# Patient Record
Sex: Female | Born: 1950 | Race: White | Hispanic: No | Marital: Single | Smoking: Former smoker
Health system: Southern US, Community
[De-identification: ages and names within clinical notes are randomized; demographics above are authoritative.]

## PROBLEM LIST (undated history)

## (undated) DIAGNOSIS — D1803 Hemangioma of intra-abdominal structures: Secondary | ICD-10-CM

## (undated) DIAGNOSIS — H02429 Myogenic ptosis of unspecified eyelid: Secondary | ICD-10-CM

## (undated) DIAGNOSIS — F419 Anxiety disorder, unspecified: Secondary | ICD-10-CM

## (undated) DIAGNOSIS — I341 Nonrheumatic mitral (valve) prolapse: Secondary | ICD-10-CM

## (undated) DIAGNOSIS — M94269 Chondromalacia, unspecified knee: Secondary | ICD-10-CM

## (undated) DIAGNOSIS — G4700 Insomnia, unspecified: Secondary | ICD-10-CM

## (undated) DIAGNOSIS — K824 Cholesterolosis of gallbladder: Secondary | ICD-10-CM

## (undated) DIAGNOSIS — R112 Nausea with vomiting, unspecified: Secondary | ICD-10-CM

## (undated) DIAGNOSIS — IMO0002 Reserved for concepts with insufficient information to code with codable children: Secondary | ICD-10-CM

## (undated) DIAGNOSIS — Z87442 Personal history of urinary calculi: Secondary | ICD-10-CM

## (undated) DIAGNOSIS — R55 Syncope and collapse: Secondary | ICD-10-CM

## (undated) DIAGNOSIS — T4145XA Adverse effect of unspecified anesthetic, initial encounter: Secondary | ICD-10-CM

## (undated) DIAGNOSIS — R011 Cardiac murmur, unspecified: Secondary | ICD-10-CM

## (undated) DIAGNOSIS — E7800 Pure hypercholesterolemia, unspecified: Secondary | ICD-10-CM

## (undated) DIAGNOSIS — M1990 Unspecified osteoarthritis, unspecified site: Secondary | ICD-10-CM

## (undated) DIAGNOSIS — H02413 Mechanical ptosis of bilateral eyelids: Secondary | ICD-10-CM

## (undated) DIAGNOSIS — Z98811 Dental restoration status: Secondary | ICD-10-CM

## (undated) DIAGNOSIS — I639 Cerebral infarction, unspecified: Secondary | ICD-10-CM

## (undated) DIAGNOSIS — I469 Cardiac arrest, cause unspecified: Secondary | ICD-10-CM

## (undated) DIAGNOSIS — K219 Gastro-esophageal reflux disease without esophagitis: Secondary | ICD-10-CM

## (undated) DIAGNOSIS — Z9889 Other specified postprocedural states: Secondary | ICD-10-CM

## (undated) DIAGNOSIS — R0602 Shortness of breath: Secondary | ICD-10-CM

## (undated) DIAGNOSIS — I480 Paroxysmal atrial fibrillation: Secondary | ICD-10-CM

## (undated) DIAGNOSIS — M797 Fibromyalgia: Secondary | ICD-10-CM

## (undated) DIAGNOSIS — T8859XA Other complications of anesthesia, initial encounter: Secondary | ICD-10-CM

## (undated) DIAGNOSIS — S83289A Other tear of lateral meniscus, current injury, unspecified knee, initial encounter: Secondary | ICD-10-CM

## (undated) DIAGNOSIS — S83249A Other tear of medial meniscus, current injury, unspecified knee, initial encounter: Secondary | ICD-10-CM

## (undated) DIAGNOSIS — C539 Malignant neoplasm of cervix uteri, unspecified: Secondary | ICD-10-CM

## (undated) HISTORY — DX: Hemangioma of intra-abdominal structures: D18.03

## (undated) HISTORY — DX: Insomnia, unspecified: G47.00

## (undated) HISTORY — DX: Pure hypercholesterolemia, unspecified: E78.00

## (undated) HISTORY — DX: Nonrheumatic mitral (valve) prolapse: I34.1

## (undated) HISTORY — DX: Malignant neoplasm of cervix uteri, unspecified: C53.9

## (undated) HISTORY — DX: Paroxysmal atrial fibrillation: I48.0

## (undated) HISTORY — DX: Cholesterolosis of gallbladder: K82.4

## (undated) HISTORY — PX: TUBAL LIGATION: SHX77

## (undated) HISTORY — PX: DILATION AND CURETTAGE OF UTERUS: SHX78

## (undated) HISTORY — PX: KIDNEY STONE SURGERY: SHX686

---
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
## 1982-06-10 DIAGNOSIS — C539 Malignant neoplasm of cervix uteri, unspecified: Secondary | ICD-10-CM

## 1982-06-10 HISTORY — DX: Malignant neoplasm of cervix uteri, unspecified: C53.9

## 1989-06-10 HISTORY — PX: TOTAL ABDOMINAL HYSTERECTOMY: SHX209

## 2003-06-11 DIAGNOSIS — R55 Syncope and collapse: Secondary | ICD-10-CM

## 2003-06-11 HISTORY — DX: Syncope and collapse: R55

## 2004-03-19 ENCOUNTER — Inpatient Hospital Stay (HOSPITAL_COMMUNITY): Admission: EM | Admit: 2004-03-19 | Discharge: 2004-03-20 | Payer: Self-pay | Admitting: *Deleted

## 2004-06-21 ENCOUNTER — Other Ambulatory Visit: Admission: RE | Admit: 2004-06-21 | Discharge: 2004-06-21 | Payer: Self-pay | Admitting: Family Medicine

## 2004-06-21 ENCOUNTER — Ambulatory Visit (HOSPITAL_COMMUNITY): Admission: RE | Admit: 2004-06-21 | Discharge: 2004-06-21 | Payer: Self-pay | Admitting: Family Medicine

## 2007-06-24 ENCOUNTER — Other Ambulatory Visit: Admission: RE | Admit: 2007-06-24 | Discharge: 2007-06-24 | Payer: Self-pay | Admitting: Family Medicine

## 2007-09-09 ENCOUNTER — Emergency Department (HOSPITAL_COMMUNITY): Admission: EM | Admit: 2007-09-09 | Discharge: 2007-09-09 | Payer: Self-pay | Admitting: Emergency Medicine

## 2009-02-27 ENCOUNTER — Emergency Department (HOSPITAL_COMMUNITY): Admission: EM | Admit: 2009-02-27 | Discharge: 2009-02-28 | Payer: Self-pay | Admitting: Emergency Medicine

## 2010-06-07 ENCOUNTER — Emergency Department (HOSPITAL_COMMUNITY)
Admission: EM | Admit: 2010-06-07 | Discharge: 2010-06-07 | Payer: Self-pay | Source: Home / Self Care | Admitting: Emergency Medicine

## 2010-06-21 ENCOUNTER — Encounter
Admission: RE | Admit: 2010-06-21 | Discharge: 2010-06-21 | Payer: Self-pay | Source: Home / Self Care | Attending: Family Medicine | Admitting: Family Medicine

## 2010-09-14 LAB — URINALYSIS, ROUTINE W REFLEX MICROSCOPIC
Bilirubin Urine: NEGATIVE
Glucose, UA: NEGATIVE mg/dL
Hgb urine dipstick: NEGATIVE
Ketones, ur: NEGATIVE mg/dL
Nitrite: NEGATIVE
Protein, ur: NEGATIVE mg/dL
Specific Gravity, Urine: 1.009 (ref 1.005–1.030)
Urobilinogen, UA: 0.2 mg/dL (ref 0.0–1.0)
pH: 8 (ref 5.0–8.0)

## 2010-10-26 NOTE — H&P (Signed)
NAMESAMANTHAJO, PAYANO           ACCOUNT NO.:  [id number]   MEDICAL RECORD NO.:  [id number]          PATIENT TYPE:  INP   LOCATION:  0350                         FACILITY:  [facility]   PHYSICIAN:  Kela Millin, M.D.DATE OF BIRTH:  05/03/1951   DATE OF ADMISSION:  03/19/2004  DATE OF DISCHARGE:                                HISTORY & PHYSICAL   PRIMARY CARE PHYSICIAN:  Stacie Acres. White, M.D.   CHIEF COMPLAINT:  Back pain, syncope, and vomiting.   HISTORY OF PRESENT ILLNESS:  The patient is a 60 year old white female who  presents with the above complaints x1 day.  She states that she began having  low back pain early in the a.m. of presentation, and after a while it  localized to her right lower back area, and she took aspirin, Advil, and  Tylenol with codeine with no relief.  She had some nausea and vomiting, but  denied hematemesis.  She began to feel lightheaded, went into her living  room, sat down, and then passed out.  When she woke up, she was on the  floor.  She denies chest pain, palpitations, cough, fevers, dysuria,  hematemesis, hematochezia, shortness of breath, and no PND.  Also no  hematuria, and no leg swelling.   The patient was evaluated in the ER and given anti-emetics, but nausea and  vomiting persisted, and also a CT scan of the abdomen was done and noted to  be abnormal, and the patient is admitted to the [facility] hospitalist service  for further evaluation and management.   PAST MEDICAL HISTORY:  1.  History of mitral valve prolapse.  2.  History of patent ductus - status post surgery.   PAST SURGICAL HISTORY:  Status post hysterectomy in 1991.   MEDICATIONS:  Estrogen.   ALLERGIES:  No known drug allergies.   SOCIAL HISTORY:  Quit tobacco about 14 years ago.  Denies alcohol.  Also  denies illicit drug use.   FAMILY HISTORY:  Dad had a PE and myocardial infarction at age 10.   REVIEW OF SYSTEMS:  As per HPI.  Other comprehensive review of systems  negative.   PHYSICAL EXAMINATION:  GENERAL:  The patient is a middle-aged white female,  in no acute distress.  VITAL SIGNS:  Blood pressure 141/91, pulse 93, respiratory rate 20, and O2  saturation 98%.  HEENT:  Pupils equal, round and reactive to light.  Extraocular movements  intact.  Slightly dry mucous membranes.  Sclerae are anicteric.  No oral  exudates.  NECK:  Supple.  No adenopathy, no thyromegaly.  No carotid bruits.  LUNGS:  Clear to auscultation bilaterally.  No crackles or wheezes.  CARDIOVASCULAR:  Normal S1 and S2.  Regular rate and rhythm.  No murmurs  appreciated.  ABDOMEN:  Soft.  Bowel sounds present.  Nontender, nondistended.  No  organomegaly and no masses palpable.  BACK:  Right paralumbar tenderness.  No tenderness over vertebrae.  EXTREMITIES:  No clubbing, cyanosis, or edema.  NEUROLOGIC:  Alert and oriented x3.  Cranial nerves II-XII grossly intact.  Nonfocal exam.   LABORATORY DATA:  CT scan of  the abdomen - there is a 17 mm low density  lesion in the inferior aspect of the right lobe of the liver.  There is a  possible 2 mm stone in the distal right ureter, and the patient also noted  to have scattered phleboliths, and calcification in ureter might be a  phlebolith rather than a ureteral stone.  There is a slight dilatation of  the right mid ureter, and there is a slight prominence of the pancreatic  head noted.   Abdominal ultrasound - multiple gallbladder polyps, the largest measuring 7  mm.  A small echogenic lesion without the inferior right lobe of the liver  measuring about 7 mm, most likely representing a small hemangioma.  No  evidence of a discrete pancreatic mass or peripancreatic soft tissue  abnormality.   The white cell count is 11, the hemoglobin is 13.1, hematocrit 39.2.  The  neutrophil count is 81%.  The sodium is 138, potassium 3.6, chloride 107,  CO2 is 24, BUN 12, glucose 129, creatinine 0.6, calcium 9.0.  The urinalysis  is cloudy  with a pH of 8.5, specific gravity of 1.019, nitrite negative,  small leukocyte esterase, and 0-2 WBC's.  Many uterus epithelial cells.   ASSESSMENT AND PLAN:  1.  Syncope - (?) vasovagal.  Will obtain CT scan of the head.  Will also      check cardiac enzymes and EKG, telemetry monitoring, and check      orthostatics and follow.  2.  Phlebolith versus ureteral stone - the patient with back pain, nausea,      vomiting.  CT scan as discussed above.  No hematuria.  Will hydrate with      IV fluids, analgesics for pain control, and muscle relaxants.  Will      strain urine.  Follow and consider a urology consult for further      evaluation/recommendations.  3.  Volume depletion - IV hydration as above.  Follow.  4.  Elevated blood glucose.  Will check a fasting blood glucose, as well as      hemoglobin A1C in a.m. and follow.  5.  Gallstones polyps/hepatic hemangioma - periodic ultrasound follow up as      recommended per radiology.      ACV/MEDQ  D:  03/20/2004  T:  03/20/2004  Job:  [id number]   cc:   Stacie Acres. White, M.D.  [address]  [place]  [place] [address]  Fax: [phone]

## 2010-10-26 NOTE — Discharge Summary (Signed)
[name], [name]           ACCOUNT NO.:  [id number]   MEDICAL RECORD NO.:  [id number]          PATIENT TYPE:  INP   LOCATION:  0350                         FACILITY:  [facility]   PHYSICIAN:  Corinna L. Lendell Caprice, MDDATE OF BIRTH:  06-29-1950   DATE OF ADMISSION:  03/19/2004  DATE OF DISCHARGE:  03/20/2004                                 DISCHARGE SUMMARY   DIAGNOSES:  1.  Syncope secondary to pain and vasovagal response.  2.  Right ureteral stone.  3.  Vomiting secondary to above.  4.  Hyperglycemia, resolved.  5.  Status post hysterectomy.  6.  History of mitral valve prolapse.  7.  History of patent ductus arteriosus.   DISCHARGE MEDICATIONS:  1.  Percocet one to two p.o. q.4 h. p.r.n. pain, #10 more dispensed.  2.  Compazine 10 mg p.o. q.6 h. p.r.n. pain.  3.  Estradiol 0.5 mg p.o. daily.   Follow up with Dr. Cliffton Asters as needed.   ACTIVITY:  She may return to work in a week.   DIET:  Regular with increased fluid intake.   CONDITION:  Stable.   CONSULTATIONS:  None.   PERTINENT LABORATORY DATA:  Amylase 58, cardiac enzymes negative, LFTs  normal.  Urine showed negative nitrites, small leukocyte esterase but was  contaminated with many epithelial cells.  She had amorphous phosphates in  her urine.  Glucose was 129, otherwise her BMET was normal.  CBC was  significant for a white blood cell count of 11.   SPECIAL STUDIES AND RADIOLOGY:  1.  CT of the abdomen showed right liver lobe hemangioma, slight dilatation      of the right mid ureter, and 2 mm calcification in the distal right      ureter possibly ureteral stone versus phlebolith.  2.  Ultrasound of the right upper quadrant showed the hemangioma and      gallbladder polyps.  Needs ultrasonic follow up.  3.  EKG showed normal sinus rhythm and complete right bundle branch block.   HISTORY AND HOSPITAL COURSE:  Allison Hunt is a pleasant 60 year old white  female who presented with syncope.  She was experiencing  severe back pain,  nausea and vomiting.  The pain was in the right flank.  It was unrelieved by  aspirin, Advil, or Tylenol with Codeine.  She felt lightheaded, sat down on  her couch and passed out.  When she awoke, she was on the floor.  On exam, she had normal vital signs  and a right-sided CVA tenderness.  She was admitted.  She had negative CT of  the brain as well.  Her pain, nausea, vomiting improved.  At the time of  discharge, she was ambulating, tolerating a normal diet, feeling much  better.      Cori   CLS/MEDQ  D:  03/20/2004  T:  03/20/2004  Job:  [id number]   cc:   Stacie Acres. White, M.D.  [address]  [place]  [place] [address]  Fax: [phone]

## 2011-03-05 LAB — COMPREHENSIVE METABOLIC PANEL
ALT: 14
AST: 15
Albumin: 3.5
Alkaline Phosphatase: 59
BUN: 14
CO2: 27
Calcium: 8.2 — ABNORMAL LOW
Chloride: 105
Creatinine, Ser: 0.56
GFR calc Af Amer: >60
GFR calc non Af Amer: >60
Glucose, Bld: 114 — ABNORMAL HIGH
Potassium: 3.8
Sodium: 139
Total Bilirubin: 0.6
Total Protein: 6.2

## 2011-03-05 LAB — URINALYSIS, ROUTINE W REFLEX MICROSCOPIC
Bilirubin Urine: NEGATIVE
Glucose, UA: NEGATIVE
Hgb urine dipstick: NEGATIVE
Ketones, ur: NEGATIVE
Nitrite: NEGATIVE
Protein, ur: NEGATIVE
Specific Gravity, Urine: 1.026
Urobilinogen, UA: 1
pH: 7

## 2011-03-05 LAB — DIFFERENTIAL
Basophils Absolute: 0
Basophils Relative: 0
Eosinophils Absolute: 0.1
Eosinophils Relative: 1
Lymphocytes Relative: 10 — ABNORMAL LOW
Lymphs Abs: 0.8
Monocytes Absolute: 0.4
Monocytes Relative: 5
Neutro Abs: 6.5
Neutrophils Relative %: 83 — ABNORMAL HIGH

## 2011-03-05 LAB — CBC
HCT: 35.9 — ABNORMAL LOW
Hemoglobin: 12.6
MCHC: 35.2
MCV: 87
Platelets: 310
RBC: 4.13
RDW: 12.6
WBC: 7.9

## 2011-03-05 LAB — URINE MICROSCOPIC-ADD ON

## 2011-03-05 LAB — LIPASE, BLOOD: Lipase: 21

## 2011-08-19 ENCOUNTER — Encounter: Payer: Self-pay | Admitting: *Deleted

## 2011-09-04 ENCOUNTER — Ambulatory Visit
Admission: RE | Admit: 2011-09-04 | Discharge: 2011-09-04 | Disposition: A | Discharge status: Home / Self Care | Payer: BC Managed Care – PPO | Source: Ambulatory Visit | Attending: Family Medicine | Admitting: Family Medicine

## 2011-09-04 ENCOUNTER — Other Ambulatory Visit: Payer: Self-pay | Admitting: Family Medicine

## 2011-09-04 DIAGNOSIS — W19XXXA Unspecified fall, initial encounter: Secondary | ICD-10-CM

## 2011-09-09 DIAGNOSIS — S83289A Other tear of lateral meniscus, current injury, unspecified knee, initial encounter: Secondary | ICD-10-CM

## 2011-09-09 DIAGNOSIS — M94269 Chondromalacia, unspecified knee: Secondary | ICD-10-CM

## 2011-09-09 DIAGNOSIS — S83249A Other tear of medial meniscus, current injury, unspecified knee, initial encounter: Secondary | ICD-10-CM

## 2011-09-09 HISTORY — DX: Chondromalacia, unspecified knee: M94.269

## 2011-09-09 HISTORY — DX: Other tear of medial meniscus, current injury, unspecified knee, initial encounter: S83.249A

## 2011-09-09 HISTORY — DX: Other tear of lateral meniscus, current injury, unspecified knee, initial encounter: S83.289A

## 2011-09-19 ENCOUNTER — Other Ambulatory Visit: Payer: Self-pay | Admitting: Physician Assistant

## 2011-09-19 DIAGNOSIS — M25469 Effusion, unspecified knee: Secondary | ICD-10-CM

## 2011-09-21 ENCOUNTER — Other Ambulatory Visit: Payer: BC Managed Care – PPO

## 2011-09-23 ENCOUNTER — Ambulatory Visit
Admission: RE | Admit: 2011-09-23 | Discharge: 2011-09-23 | Disposition: A | Discharge status: Home / Self Care | Payer: BC Managed Care – PPO | Source: Ambulatory Visit | Attending: Physician Assistant | Admitting: Physician Assistant

## 2011-09-23 DIAGNOSIS — M25469 Effusion, unspecified knee: Secondary | ICD-10-CM

## 2011-09-27 ENCOUNTER — Other Ambulatory Visit: Payer: Self-pay | Admitting: Orthopaedic Surgery

## 2011-10-03 ENCOUNTER — Encounter (HOSPITAL_BASED_OUTPATIENT_CLINIC_OR_DEPARTMENT_OTHER): Payer: Self-pay | Admitting: *Deleted

## 2011-10-03 NOTE — Pre-Procedure Instructions (Signed)
Cardiology notes req. from Dr. Harvie Bridge office

## 2011-10-03 NOTE — H&P (Signed)
Allison Hunt is an 61 y.o. female.   Chief Complaint: Right knee pain HPI: She has fallen twice in the last number of weeks injuring her right knee. She now is having knee pain at work and at rest. A recent MRI scan shows medial and lateral meniscus tears. We have discussed options with her and will proceed with a right knee arthroscopy.  Past Medical History  Diagnosis Date  . Mitral valve prolapse   . Insomnia   . Positional vertigo   . Arthritis     osteoarthritis both knees  . Kidney stones 2005  . Complication of anesthesia     hard to wake up  . PONV (postoperative nausea and vomiting)   . Cervical cancer 1984  . Medial meniscus tear 09/2011    right knee  . Lateral meniscal tear 09/2011    right knee  . 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 Dental crowns present     Past Surgical History  Procedure Date  . Total abdominal hysterectomy 1991    due to cervical cancer  . Patent ductus arterious repair 1956    Family History  Problem Relation Age of Onset  . Anemia Father   . Heart attack Father   . Pulmonary embolism Father   . Alzheimer's disease Mother   . Lupus Sister   . Aortic stenosis Son     with AVR at 48 y/o   Social History:  reports that she has quit smoking. She has never used smokeless tobacco. She reports that she drinks alcohol. She reports that she does not use illicit drugs.  Allergies:  Allergies  Allergen Reactions  . Demerol Nausea Only    No prescriptions prior to admission    No results found for this or any previous visit (from the past 48 hour(s)). No results found.  Review of Systems  Constitutional: Negative.   HENT: Negative.   Eyes: Negative.   Respiratory: Negative.   Cardiovascular: Negative.   Gastrointestinal: Negative.   Genitourinary: Negative.   Musculoskeletal: Negative.   Skin: Negative.   Neurological: Negative.   Endo/Heme/Allergies: Negative.   Psychiatric/Behavioral: Negative.     Height 5' 3.5"  (1.613 m), weight 70.308 kg (155 lb). Physical Exam  Constitutional: She appears well-nourished.  HENT:  Head: Normocephalic.  Eyes: Pupils are equal, round, and reactive to light.  Neck: Normal range of motion.  Cardiovascular: Regular rhythm.   Respiratory: Breath sounds normal.  GI: Soft. Bowel sounds are normal.  Musculoskeletal:       R ight knee- 0-130  1+ effusion  Pain medial and lateral joint line with palpation. 1+ crepitation. Pain with McMurray"s  Neurological: She is alert.  Skin: Skin is warm.  Psychiatric: She has a normal mood and affect.     Assessment/Plan A: Right knee meniscus tear by MRI scan P: Proceed with right knee arthroscopy to take care of meniscus tears. Risks are discussed with patient and need for post-op rehab.  Lawerence Dery R 10/03/2011, 3:42 PM

## 2011-10-08 ENCOUNTER — Encounter (HOSPITAL_BASED_OUTPATIENT_CLINIC_OR_DEPARTMENT_OTHER): Payer: Self-pay | Admitting: *Deleted

## 2011-10-08 ENCOUNTER — Inpatient Hospital Stay (HOSPITAL_BASED_OUTPATIENT_CLINIC_OR_DEPARTMENT_OTHER)
Admission: RE | Admit: 2011-10-08 | Discharge: 2011-10-10 | DRG: 468 | Disposition: A | Discharge status: Home / Self Care | Payer: BC Managed Care – PPO | Source: Ambulatory Visit | Attending: Orthopaedic Surgery | Admitting: Orthopaedic Surgery

## 2011-10-08 ENCOUNTER — Ambulatory Visit (HOSPITAL_BASED_OUTPATIENT_CLINIC_OR_DEPARTMENT_OTHER): Payer: BC Managed Care – PPO | Admitting: *Deleted

## 2011-10-08 ENCOUNTER — Encounter (HOSPITAL_COMMUNITY)
Admission: RE | Disposition: A | Discharge status: Home / Self Care | Payer: Self-pay | Source: Ambulatory Visit | Attending: Orthopaedic Surgery

## 2011-10-08 ENCOUNTER — Inpatient Hospital Stay (HOSPITAL_COMMUNITY): Payer: BC Managed Care – PPO

## 2011-10-08 DIAGNOSIS — I4891 Unspecified atrial fibrillation: Secondary | ICD-10-CM

## 2011-10-08 DIAGNOSIS — M1710 Unilateral primary osteoarthritis, unspecified knee: Secondary | ICD-10-CM | POA: Diagnosis present

## 2011-10-08 DIAGNOSIS — I517 Cardiomegaly: Secondary | ICD-10-CM

## 2011-10-08 DIAGNOSIS — R55 Syncope and collapse: Secondary | ICD-10-CM

## 2011-10-08 DIAGNOSIS — I059 Rheumatic mitral valve disease, unspecified: Secondary | ICD-10-CM | POA: Diagnosis present

## 2011-10-08 DIAGNOSIS — IMO0002 Reserved for concepts with insufficient information to code with codable children: Secondary | ICD-10-CM | POA: Diagnosis present

## 2011-10-08 DIAGNOSIS — R112 Nausea with vomiting, unspecified: Secondary | ICD-10-CM | POA: Insufficient documentation

## 2011-10-08 DIAGNOSIS — S83249A Other tear of medial meniscus, current injury, unspecified knee, initial encounter: Secondary | ICD-10-CM

## 2011-10-08 DIAGNOSIS — Z8541 Personal history of malignant neoplasm of cervix uteri: Secondary | ICD-10-CM

## 2011-10-08 DIAGNOSIS — I469 Cardiac arrest, cause unspecified: Principal | ICD-10-CM

## 2011-10-08 DIAGNOSIS — S83289A Other tear of lateral meniscus, current injury, unspecified knee, initial encounter: Secondary | ICD-10-CM | POA: Diagnosis present

## 2011-10-08 DIAGNOSIS — I341 Nonrheumatic mitral (valve) prolapse: Secondary | ICD-10-CM

## 2011-10-08 DIAGNOSIS — M2240 Chondromalacia patellae, unspecified knee: Secondary | ICD-10-CM | POA: Diagnosis present

## 2011-10-08 DIAGNOSIS — W19XXXA Unspecified fall, initial encounter: Secondary | ICD-10-CM | POA: Diagnosis present

## 2011-10-08 DIAGNOSIS — G4700 Insomnia, unspecified: Secondary | ICD-10-CM | POA: Diagnosis present

## 2011-10-08 HISTORY — DX: Other complications of anesthesia, initial encounter: T88.59XA

## 2011-10-08 HISTORY — PX: KNEE ARTHROSCOPY: SHX127

## 2011-10-08 HISTORY — DX: Reserved for concepts with insufficient information to code with codable children: IMO0002

## 2011-10-08 HISTORY — DX: Chondromalacia, unspecified knee: M94.269

## 2011-10-08 HISTORY — DX: Nausea with vomiting, unspecified: Z98.890

## 2011-10-08 HISTORY — DX: Other tear of medial meniscus, current injury, unspecified knee, initial encounter: S83.249A

## 2011-10-08 HISTORY — DX: Syncope and collapse: R55

## 2011-10-08 HISTORY — DX: Unspecified osteoarthritis, unspecified site: M19.90

## 2011-10-08 HISTORY — DX: Cardiac arrest, cause unspecified: I46.9

## 2011-10-08 HISTORY — DX: Nausea with vomiting, unspecified: R11.2

## 2011-10-08 HISTORY — DX: Other tear of lateral meniscus, current injury, unspecified knee, initial encounter: S83.289A

## 2011-10-08 HISTORY — DX: Adverse effect of unspecified anesthetic, initial encounter: T41.45XA

## 2011-10-08 HISTORY — DX: Dental restoration status: Z98.811

## 2011-10-08 LAB — CARDIAC PANEL(CRET KIN+CKTOT+MB+TROPI)
CK, MB: 2.1 ng/mL (ref 0.3–4.0)
Relative Index: INVALID (ref 0.0–2.5)
Total CK: 49 U/L (ref 7–177)
Troponin I: <0.3 ng/mL (ref <0.30)
Troponin I: <0.3 ng/mL (ref <0.30)

## 2011-10-08 LAB — COMPREHENSIVE METABOLIC PANEL
AST: 24 U/L (ref 0–37)
BUN: 14 mg/dL (ref 6–23)
CO2: 22 mEq/L (ref 19–32)
Calcium: 9.4 mg/dL (ref 8.4–10.5)
Creatinine, Ser: 0.58 mg/dL (ref 0.50–1.10)
GFR calc Af Amer: >90 mL/min (ref >90)
GFR calc non Af Amer: >90 mL/min (ref >90)

## 2011-10-08 LAB — MRSA PCR SCREENING: MRSA by PCR: NEGATIVE

## 2011-10-08 LAB — LIPID PANEL
Cholesterol: 268 mg/dL — ABNORMAL HIGH (ref 0–200)
Triglycerides: 113 mg/dL (ref <150)
VLDL: 23 mg/dL (ref 0–40)

## 2011-10-08 LAB — PROTIME-INR: INR: 0.98 (ref 0.00–1.49)

## 2011-10-08 LAB — MAGNESIUM: Magnesium: 2 mg/dL (ref 1.5–2.5)

## 2011-10-08 SURGERY — ARTHROSCOPY, KNEE
Anesthesia: General | Site: Knee | Laterality: Right | Wound class: Clean

## 2011-10-08 MED ORDER — MIDAZOLAM HCL 5 MG/5ML IJ SOLN
INTRAMUSCULAR | Status: DC | PRN
  Administered 2011-10-08: 2 mg via INTRAVENOUS

## 2011-10-08 MED ORDER — PROMETHAZINE HCL 25 MG/ML IJ SOLN
12.5000 mg | Freq: Four times a day (QID) | INTRAMUSCULAR | Status: DC | PRN
  Administered 2011-10-08: 12.5 mg via INTRAVENOUS
  Filled 2011-10-08 (×3): qty 1

## 2011-10-08 MED ORDER — HYDROMORPHONE HCL PF 1 MG/ML IJ SOLN
0.2500 mg | INTRAMUSCULAR | Status: DC | PRN
  Administered 2011-10-08: 0.5 mg via INTRAVENOUS

## 2011-10-08 MED ORDER — ACETAMINOPHEN 325 MG PO TABS: 650.0000 mg | ORAL_TABLET | ORAL | Status: DC | PRN

## 2011-10-08 MED ORDER — LACTATED RINGERS IV SOLN: INTRAVENOUS | Status: DC

## 2011-10-08 MED ORDER — TRAMADOL HCL 50 MG PO TABS: 50.0000 mg | ORAL_TABLET | Freq: Four times a day (QID) | ORAL | Status: AC | PRN

## 2011-10-08 MED ORDER — ONDANSETRON HCL 4 MG/2ML IJ SOLN
4.0000 mg | Freq: Four times a day (QID) | INTRAMUSCULAR | Status: DC | PRN
  Administered 2011-10-08 – 2011-10-10 (×2): 4 mg via INTRAVENOUS
  Filled 2011-10-08: qty 2

## 2011-10-08 MED ORDER — ONDANSETRON HCL 4 MG/2ML IJ SOLN: 4.0000 mg | Freq: Once | INTRAMUSCULAR | Status: DC | PRN

## 2011-10-08 MED ORDER — SODIUM CHLORIDE 0.9 % IV SOLN
INTRAVENOUS | Status: DC
  Administered 2011-10-08: 15:00:00 via INTRAVENOUS

## 2011-10-08 MED ORDER — DEXAMETHASONE SODIUM PHOSPHATE 4 MG/ML IJ SOLN
INTRAMUSCULAR | Status: DC | PRN
  Administered 2011-10-08: 10 mg via INTRAVENOUS

## 2011-10-08 MED ORDER — CHLORHEXIDINE GLUCONATE 4 % EX LIQD: 60.0000 mL | Freq: Once | CUTANEOUS | Status: DC

## 2011-10-08 MED ORDER — LIDOCAINE HCL (CARDIAC) 20 MG/ML IV SOLN
INTRAVENOUS | Status: DC | PRN
  Administered 2011-10-08: 100 mg via INTRAVENOUS

## 2011-10-08 MED ORDER — LORAZEPAM 2 MG/ML IJ SOLN
0.5000 mg | Freq: Four times a day (QID) | INTRAMUSCULAR | Status: DC
  Administered 2011-10-08: 0.5 mg via INTRAVENOUS
  Filled 2011-10-08: qty 1

## 2011-10-08 MED ORDER — LORAZEPAM 2 MG/ML IJ SOLN
INTRAMUSCULAR | Status: AC
  Administered 2011-10-08: 0.5 mg via INTRAVENOUS
  Filled 2011-10-08: qty 1

## 2011-10-08 MED ORDER — MORPHINE SULFATE 2 MG/ML IJ SOLN: 0.0500 mg/kg | INTRAMUSCULAR | Status: DC | PRN

## 2011-10-08 MED ORDER — SODIUM CHLORIDE 0.9 % IV SOLN
INTRAVENOUS | Status: DC
  Administered 2011-10-08: 100 mL/h via INTRAVENOUS

## 2011-10-08 MED ORDER — PROPOFOL 10 MG/ML IV EMUL
INTRAVENOUS | Status: DC | PRN
  Administered 2011-10-08: 200 mg via INTRAVENOUS

## 2011-10-08 MED ORDER — LACTATED RINGERS IV SOLN
INTRAVENOUS | Status: DC
  Administered 2011-10-08 (×2): via INTRAVENOUS

## 2011-10-08 MED ORDER — FENTANYL CITRATE 0.05 MG/ML IJ SOLN
INTRAMUSCULAR | Status: DC | PRN
  Administered 2011-10-08: 100 ug via INTRAVENOUS
  Administered 2011-10-08: 50 ug via INTRAVENOUS

## 2011-10-08 MED ORDER — CEFAZOLIN SODIUM-DEXTROSE 2-3 GM-% IV SOLR
2.0000 g | INTRAVENOUS | Status: AC
  Administered 2011-10-08: 2 g via INTRAVENOUS

## 2011-10-08 MED ORDER — ONDANSETRON HCL 4 MG/2ML IJ SOLN
INTRAMUSCULAR | Status: DC | PRN
  Administered 2011-10-08: 4 mg via INTRAVENOUS

## 2011-10-08 MED ORDER — ENOXAPARIN SODIUM 40 MG/0.4ML ~~LOC~~ SOLN
40.0000 mg | SUBCUTANEOUS | Status: DC
  Administered 2011-10-08 – 2011-10-09 (×2): 40 mg via SUBCUTANEOUS
  Filled 2011-10-08 (×3): qty 0.4

## 2011-10-08 MED ORDER — ONDANSETRON HCL 4 MG/2ML IJ SOLN
INTRAMUSCULAR | Status: AC
  Administered 2011-10-08: 4 mg via INTRAVENOUS
  Filled 2011-10-08: qty 2

## 2011-10-08 SURGICAL SUPPLY — 39 items
BANDAGE ELASTIC 6 VELCRO ST LF (GAUZE/BANDAGES/DRESSINGS) ×2 IMPLANT
BANDAGE GAUZE ELAST BULKY 4 IN (GAUZE/BANDAGES/DRESSINGS) ×2 IMPLANT
BLADE CUDA 5.5 (BLADE) IMPLANT
BLADE CUDA SHAVER 3.5 (BLADE) ×2 IMPLANT
BLADE GREAT WHITE 4.2 (BLADE) ×2 IMPLANT
CANISTER OMNI JUG 16 LITER (MISCELLANEOUS) IMPLANT
CANISTER SUCTION 2500CC (MISCELLANEOUS) IMPLANT
DRAPE ARTHROSCOPY W/POUCH 114 (DRAPES) ×2 IMPLANT
DRAPE U-SHAPE 47X51 STRL (DRAPES) ×2 IMPLANT
DRSG EMULSION OIL 3X3 NADH (GAUZE/BANDAGES/DRESSINGS) ×2 IMPLANT
DURAPREP 26ML APPLICATOR (WOUND CARE) ×2 IMPLANT
ELECT MENISCUS 165MM 90D (ELECTRODE) IMPLANT
ELECT REM PT RETURN 9FT ADLT (ELECTROSURGICAL)
ELECTRODE REM PT RTRN 9FT ADLT (ELECTROSURGICAL) IMPLANT
GLOVE BIO SURGEON STRL SZ8.5 (GLOVE) ×2 IMPLANT
GLOVE BIOGEL PI IND STRL 8 (GLOVE) ×1 IMPLANT
GLOVE BIOGEL PI IND STRL 8.5 (GLOVE) ×1 IMPLANT
GLOVE BIOGEL PI INDICATOR 8 (GLOVE) ×1
GLOVE BIOGEL PI INDICATOR 8.5 (GLOVE) ×1
GLOVE SS BIOGEL STRL SZ 8 (GLOVE) ×1 IMPLANT
GLOVE SUPERSENSE BIOGEL SZ 8 (GLOVE) ×1
GOWN PREVENTION PLUS XLARGE (GOWN DISPOSABLE) ×4 IMPLANT
GOWN PREVENTION PLUS XXLARGE (GOWN DISPOSABLE) ×2 IMPLANT
KNEE WRAP E Z 3 GEL PACK (MISCELLANEOUS) ×2 IMPLANT
PACK ARTHROSCOPY DSU (CUSTOM PROCEDURE TRAY) ×2 IMPLANT
PACK BASIN DAY SURGERY FS (CUSTOM PROCEDURE TRAY) ×2 IMPLANT
PAD CAST 4YDX4 CTTN HI CHSV (CAST SUPPLIES) ×1 IMPLANT
PADDING CAST COTTON 4X4 STRL (CAST SUPPLIES) ×2
PENCIL BUTTON HOLSTER BLD 10FT (ELECTRODE) IMPLANT
SET ARTHROSCOPY TUBING (MISCELLANEOUS) ×2
SET ARTHROSCOPY TUBING LN (MISCELLANEOUS) ×1 IMPLANT
SHEET MEDIUM DRAPE 40X70 STRL (DRAPES) ×2 IMPLANT
SPONGE GAUZE 4X4 12PLY (GAUZE/BANDAGES/DRESSINGS) ×2 IMPLANT
SYR 3ML 18GX1 1/2 (SYRINGE) IMPLANT
TOWEL OR 17X24 6PK STRL BLUE (TOWEL DISPOSABLE) ×2 IMPLANT
TOWEL OR NON WOVEN STRL DISP B (DISPOSABLE) ×2 IMPLANT
WAND 30 DEG SABER W/CORD (SURGICAL WAND) IMPLANT
WAND STAR VAC 90 (SURGICAL WAND) IMPLANT
WATER STERILE IRR 1000ML POUR (IV SOLUTION) ×2 IMPLANT

## 2011-10-08 NOTE — Op Note (Signed)
[id number] 

## 2011-10-08 NOTE — Progress Notes (Signed)
EKG done, but time stamp on EKG states 1240, EKG was done at 1340

## 2011-10-08 NOTE — Anesthesia Preprocedure Evaluation (Addendum)

## 2011-10-08 NOTE — Op Note (Signed)
NAMEKASHAUNA, CELMER NO.:  [id number]  MEDICAL RECORD NO.:  [id number]  LOCATION:  2906                         FACILITY:  [facility]  PHYSICIAN:  Lubertha Basque. Darus Hershman, M.D.DATE OF BIRTH:  1951-01-10  DATE OF PROCEDURE:  10/08/2011 DATE OF DISCHARGE:                              OPERATIVE REPORT   PREOPERATIVE DIAGNOSES: 1. Right knee torn medial and torn lateral meniscus. 2. Right knee chondromalacia.  POSTOPERATIVE DIAGNOSES: 1. Right knee torn medial and torn lateral meniscus. 2. Right knee chondromalacia.  PROCEDURES: 1. Right knee partial medial and partial lateral meniscectomy. 2. Right knee abrasion chondroplasty, patellofemoral and medial.  ATTENDING SURGEON:  Lubertha Basque. Jerl Santos, M.D.  ASSISTANT:  Lindwood Qua, P.A.  INDICATION FOR PROCEDURE:  The patient is a 61 year old woman with a long history of right knee pain and swelling.  This is persisted despite conservative measures.  By MRI scan, she has meniscal tears.  She has pain, which limits her ability to rest and walk and she is offered an arthroscopy.  Informed operative consent was obtained after discussion of possible complications including reaction to anesthesia and infection.  SUMMARY OF FINDINGS AND PROCEDURE:  Under general anesthesia, an arthroscopy of the right knee was performed.  Suprapatellar pouch was benign while the patellofemoral joint exhibited some focal breakdown with chondroplasty and abrasion to bleeding bone performed in some small areas.  Medial compartment exhibited a posterior horn medial meniscus tear addressed about a 5% partial medial meniscectomy.  She also had a small 1/2 dime sized area on the medial femoral condyle with grade 3 change and a chondroplasty was done there.  The ACL was normal.  Lateral compartment also exhibited a posterior horn and middle horn tear addressed about 10% partial lateral meniscectomy.  There were no degenerative changes in the  lateral compartment.  She was discharged home the same day.  DESCRIPTION OF PROCEDURE:  The patient was taken to the operating suite where general anesthetic was applied without difficulty.  She was positioned supine and prepped and draped in normal sterile fashion. After administration of IV Kefzol, an arthroscopy of the right knee was performed through total of 2 portals.  Findings were as noted above and procedure consisted predominantly of the medial and lateral meniscectomies done with baskets and shavers.  We also performed an abrasion chondroplasty and chondroplasty as described above.  The knee was thoroughly irrigated, followed by placement of Marcaine with epinephrine and morphine.  Adaptic was placed over the portals, followed by dry gauze and a loose Ace wrap.  Estimated blood loss and fluids obtained from anesthesia records.  DISPOSITION:  The patient was extubated in the operating room and taken to recovery room in stable condition.  Plans were for her to go home on the same day and follow up in the office closely.    ADDENDUM: Patient experienced asystole twice in recovery room and was admitted to CCU with cardiology assistance.  Eventually ruled out and was attributed to extreme vagal response which it turns out she has had in past with other procedures.   Lubertha Basque Jerl Santos, M.D.     PGD/MEDQ  D:  10/08/2011  T:  10/08/2011  Job:  [id number]

## 2011-10-08 NOTE — Consult Note (Addendum)
CARDIOLOGY CONSULT NOTE   Patient ID: Allison Hunt MRN: [id number] DOB/AGE: 1950/12/17 61 y.o.  Admit date: 10/08/2011  Primary Physician   Bradd Burner, PA, PA Primary Cardiologist   Nahser Reason for Consultation   asystole  WUJ:[id number] Telleria is a 61 y.o. female with no history of CAD.   She has a history of vasovagal syncope in 2005 with an ER visit for near-syncope in 2011.  She was in the OP surgical center today for knee surgery. Post-op, she was extremely nauseated and had a period of asystole requiring CPR.   Note from surgical center:At patient bedside, pt. States feeling sick, began to raise HOB, ekg shows asystole, no radial pulse present, head lowered, chest compressions began immediately, code called, preparing to begin bag breathing, pt. Spontaneously opened eyes and spoke.  Dr. Krista Blue and [facility] to bedside. 12 lead EKG obtained showing a fib. EKG tracing on monitor showing a fib.  Resolved at 1338.  Event recurred at 1349. Pt. States feels nauseated, ekg shows asystole, absent radial pulse, chest compressions begun, for less than one minute, pt. Spontaneously opens eyes, looks around. Vomits. Dr. Krista Blue and [facility] to bedside.  Continue monitoring with pacing/defib pads in place.  Pt transferred to CCU. Currently c/o nausea and is in atrial fib with a controlled ventricular response. She has occasional palpitations, especially at night when she turns on her side but is not currently aware of an irregular heart rate. She has no history of significant DOE or chest pain.    Past Medical History  Diagnosis Date  . Mitral valve prolapse   . Insomnia   . Positional vertigo   . Arthritis     osteoarthritis both knees  . Kidney stones 2005  . Complication of anesthesia     hard to wake up  . PONV (postoperative nausea and vomiting)   . Cervical cancer 1984  . Medial meniscus tear 09/2011    right knee  . Lateral meniscal tear 09/2011    right knee    . 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 Dental crowns present   . Syncope, vasovagal 2005    in setting of N&V, pain from a ureteral stone     Past Surgical History  Procedure Date  . Total abdominal hysterectomy 1991    due to cervical cancer  . Patent ductus arterious repair 1956    Allergies  Allergen Reactions  . Demerol Nausea Only    I have reviewed the patient's current medications   .  ceFAZolin (ANCEF) IV  2 g Intravenous 60 min Pre-Op  . DISCONTD: chlorhexidine  60 mL Topical Once      . DISCONTD: lactated ringers    . DISCONTD: lactated ringers     DISCONTD: HYDROmorphone, DISCONTD: morphine, DISCONTD: ondansetron (ZOFRAN) IV  Prescriptions prior to admission  Medication Sig Dispense Refill  . calcium carbonate (OS-CAL) 600 MG TABS Take 600 mg by mouth 2 (two) times daily with a meal.      . cholecalciferol (VITAMIN D) 1000 UNITS tablet Take 1,000 Units by mouth daily.      . diclofenac (VOLTAREN) 75 MG EC tablet Take 75 mg by mouth 2 (two) times daily.      . zaleplon (SONATA) 10 MG capsule Take 10 mg by mouth at bedtime.          History   Social History  . Marital Status: Single    Spouse Name: N/A    Number  of Children: N/A  . Years of Education: N/A   Occupational History  . Flight attendant    Social History Main Topics  . Smoking status: Former Games developer  . Smokeless tobacco: Never Used   Comment: quit smoking 22 years ago  . Alcohol Use: Yes     4-5 x/week  . Drug Use: No  . Sexually Active:    Other Topics Concern  . Not on file   Social History Narrative       Family History  Problem Relation Age of Onset  . Anemia Father   . Heart attack Father   . Pulmonary embolism Father   . Alzheimer's disease Mother   . Lupus Sister   . Aortic stenosis Son     with AVR at 60 y/o    ROS: Knee pain occasional other joint pains. No recent illnesses, fevers or chills. No history of reflux, no melena. Full 14 point review of systems  complete and found to be negative unless listed above.  Physical Exam: Blood pressure 130/60, pulse 93, temperature 97.6 F (36.4 C), temperature source Oral, resp. rate 20, height 5' 3.5" (1.613 m), weight 155 lb (70.308 kg), SpO2 99.00%.  General: Well developed, well nourished, female in mild distress secondary to nausea. Pale and clammy Head: Eyes PERRLA, No xanthomas.   Normocephalic and atraumatic, oropharynx without edema or exudate. Dentition good Lungs: Clear Bilaterally Heart: Heart irregular rate and rhythm with S1, S2, no murmur. pulses are 2+ all 4 extrem.   Neck: No carotid bruit. No lymphadenopathy.  No JVD. Abdomen: Bowel sounds present, abdomen soft and non-tender without masses or hernias noted. Msk:  No spine or cva tenderness. No weakness, no joint deformities or effusions. Extremities: No clubbing or cyanosis. No edema.  Neuro: Alert and oriented X 3. No focal deficits noted. Psych:  Good affect, responds appropriately Skin: No rashes or lesions noted. RLE is bandaged and dressed, incision without drainage and not disturbed.  Labs:   Lab Results  Component Value Date   HGB 14.1 10/08/2011   ECG:  08-Oct-2011 12:40:14 Atrial fibrillation Nonspecific ST and T wave abnormality Prolonged QT Vent. rate 77 BPM PR interval * ms QRS duration 92 ms QT/QTc 442/500 ms P-R-T axes * 69 38  ASSESSMENT AND PLAN:   The patient was seen today by Dr Ladona Ridgel, the patient evaluated and the data reviewed.  Principal Problem:  *Cardiac asystole - resuscitated with CPR and bagging, no intubation or meds used, follow  Active Problems:  Syncope, vasovagal - probable cause of asystole - rx symptoms   Atrial fibrillation - unknown frequency or duration, follow on telemetry  S/p knee surgery - management per Dr Jerl Santos  Anticoagulation - DVT prophylaxis for now, CHADS score is 0.   Signed: Theodore Demark 10/08/2011, 3:00 PM Co-Sign MD  Cardiology Attending  Patient seen  and independently examined. She has marked bradycardia, vagally induced, and a h/o syncope in the past. She has had recurrent Asystole with nausea after anesthesia. She then developed atrial fibrillation. Will attempt to treat with anti-emesis meds. Occasionally patients with this degree of vagally induced autonomic dysfunction will require pacing. Hopefully this can be avoided. Will give IV hydration with NS.  Lewayne Bunting, M.D.

## 2011-10-08 NOTE — Transfer of Care (Signed)
Immediate Anesthesia Transfer of Care Note  Patient: Allison Hunt  Procedure(s) Performed: Procedure(s) (LRB): ARTHROSCOPY KNEE (Right)  Patient Location: PACU  Anesthesia Type: General  Level of Consciousness: awake, oriented and patient cooperative  Airway & Oxygen Therapy: Patient Spontanous Breathing and Patient connected to face mask oxygen  Post-op Assessment: Report given to PACU RN, Post -op Vital signs reviewed and stable and Patient moving all extremities  Post vital signs: Reviewed and stable  Complications: No apparent anesthesia complications

## 2011-10-08 NOTE — Anesthesia Postprocedure Evaluation (Signed)
  Anesthesia Post-op Note  Patient: Allison Hunt  Procedure(s) Performed: Procedure(s) (LRB): ARTHROSCOPY KNEE (Right)  Patient Location: PACU  Anesthesia Type: General  Level of Consciousness: sedated  Airway and Oxygen Therapy: Patient Spontanous Breathing  Post-op Pain: mild  Post-op Assessment: PATIENT'S CARDIOVASCULAR STATUS UNSTABLE  Post-op Vital Signs: unstable  Complications: cardiovascular complications transferred to [facility]

## 2011-10-08 NOTE — Anesthesia Procedure Notes (Addendum)
Anesthesia Regional Block:   Narrative:    Procedure Name: LMA Insertion Date/Time: 10/08/2011 12:14 PM Performed by: Meyer Russel Pre-anesthesia Checklist: Patient identified, Emergency Drugs available, Suction available and Patient being monitored Patient Re-evaluated:Patient Re-evaluated prior to inductionOxygen Delivery Method: Circle System Utilized Preoxygenation: Pre-oxygenation with 100% oxygen Intubation Type: IV induction Ventilation: Mask ventilation without difficulty LMA: LMA inserted LMA Size: 4.0 Number of attempts: 1 Airway Equipment and Method: bite block Placement Confirmation: positive ETCO2 and breath sounds checked- equal and bilateral Tube secured with: Tape Dental Injury: Teeth and Oropharynx as per pre-operative assessment

## 2011-10-08 NOTE — Interval H&P Note (Signed)
History and Physical Interval Note:  10/08/2011 9:25 AM  Allison Hunt  has presented today for surgery, with the diagnosis of right knee mmt, lateral meniscus tear, chondromalacia  The various methods of treatment have been discussed with the patient and family. After consideration of risks, benefits and other options for treatment, the patient has consented to  Procedure(s) (LRB): ARTHROSCOPY KNEE (Right) as a surgical intervention .  The patients' history has been reviewed, patient examined, no change in status, stable for surgery.  I have reviewed the patients' chart and labs.  Questions were answered to the patient's satisfaction.     [name]

## 2011-10-08 NOTE — Progress Notes (Signed)
Allison Hunt became hypotensive and nauseated and even asystolic twice in recovery at [facility].  Was transported via [facility] to CCU and has seen Dr. Ladona Ridgel.  Has had similar episodes in past with procedures.  Apparently has extreme vagal response and becomes unconscious.  Stable now and in NSR.  Discussed knee surgery findings with her.  Will likely go home in morning.  I will see her prior to cardiology discharge tomorrow.

## 2011-10-08 NOTE — Discharge Instructions (Addendum)
Get help immediately by calling 911 or [facility] at [phone] for dizziness or if you think you are going to pass out.  Arthroscopic Knee Surgery Post -OP Instructions  You have just had an arthroscopic operation. Your incisions (puncture sites) are small and should heal quickly. The structures inside your knee may take 6-8 weeks to heal and settle down. This healing time is variable and may range from a few days to 6-8 weeks.  Pain Medication: You will be given a prescription for pain medication. Please take the medication as needed. Most patients require pain medication for only a few days.  Swelling: You can expect some swelling in your knee. Applying ice and elevating your leg will help keep the swelling to a minimum. Ice can be applied by placing ice cubes in a plastic bag and putting the bag on your knee with a towel between the ice bag and your knee. Sometimes you will be issued an ice pack wrap and that will work as well. Your entire leg should be elevated, not just your knee. Elevate your leg to or above the level of your heart. The ice should be used periodically during the first 48 hours along with continued elevation of your leg. The swelling should subside over the next several weeks.  Dressing: Fluid leakage is common the first night. Precautions to prevent staining of your clothes, bed sheets, etc., should be taken. This fluid was used to inflate the joint during surgery and is commonly tinged red from a small amount of blood. Remove your dressing tomorrow. Some bleeding or leakage from the puncture sites may occur for a few days and you should cover the puncture sites with Band-Aids until the leakage stops.Alternatively you may reapply the ace wrap with some gauze pads over the puncture sites but don't wrap it too tightly.  You may shower 2 days after surgery.   Activity: You may bend and straighten your knee as soon as it is comfortable to do so. Using your knee will help  decrease swelling and help prevent stiffness, as long as you don't overdo it. Moving your foot up and down also helps decrease the swelling. There is no harm in putting weight on; your leg as long as it is comfortable to do so. (You should not, try to run or jump). Gradually increase activity as you can tolerate. An increase in pain or swelling with certain activities or with increased activities may indicate you are doing too much. Back up and start building up your activities at a slower rate. You may need crutches for a few days.  Office Check-Up: If no major problems arise and you are progressing well, we will need to see you in the office in one (1) week unless otherwise instructed by your doctor. Please call the office to make an appointment.     Post Anesthesia Home Care Instructions  Activity: Get plenty of rest for the remainder of the day. A responsible adult should stay with you for 24 hours following the procedure.  For the next 24 hours, DO NOT: -Drive a car -Advertising copywriter -Drink alcoholic beverages -Take any medication unless instructed by your physician -Make any legal decisions or sign important papers.  Meals: Start with liquid foods such as gelatin or soup. Progress to regular foods as tolerated. Avoid greasy, spicy, heavy foods. If nausea and/or vomiting occur, drink only clear liquids until the nausea and/or vomiting subsides. Call your physician if vomiting continues.  Special Instructions/Symptoms: Your throat  may feel dry or sore from the anesthesia or the breathing tube placed in your throat during surgery. If this causes discomfort, gargle with warm salt water. The discomfort should disappear within 24 hours.

## 2011-10-08 NOTE — Progress Notes (Signed)
  Echocardiogram 2D Echocardiogram has been performed.  Allison Hunt, Real Cons 10/08/2011, 7:28 PM

## 2011-10-08 NOTE — Progress Notes (Signed)
At patient bedside, pt. States feeling sick, began to raise HOB, ekg shows asystole, no radial pulse present,  head lowered, chest compressions began immediately, code called, preparing to begin bag breathing, pt. Spontaneously opened eyes and spoke.  Dr. Krista Blue and [facility] to bedside.  12 lead EKG obtained showing a fib.  EKG tracing on monitor showing a fib. Resolved at 1338.  Event recurred at 1349.  Pt. States feels nauseated, ekg shows asystole, absent radial pulse, chest compressions begun, for less than one minute, pt. Spontaneously opens eyes, looks around.  Vomits.  Dr. Krista Blue and [facility] to bedside.  Continue monitoring with pacing/defib pads in place.

## 2011-10-08 NOTE — Progress Notes (Signed)
Events in PACU noted and discussed with Dr. Yisroel Ramming.  Pt has a remote history of CP and was evaluated by Dr. Melburn Popper who found no pathology (per patient).  I was unable to find any documentation of this evaluation.  Cardiology was via phone consulted and the patient was accepted for admission to [facility] via [facility].  She will be admitted to Dr. Lubertha Basque service, and they will evaluate the patient when she arrives to the [facility]. Report was called and the patient was transported. I discussed the episode with the patient's son (?Adam ) by telephone.

## 2011-10-09 ENCOUNTER — Encounter (HOSPITAL_COMMUNITY): Payer: Self-pay | Admitting: Cardiology

## 2011-10-09 DIAGNOSIS — I469 Cardiac arrest, cause unspecified: Principal | ICD-10-CM

## 2011-10-09 MED ORDER — POTASSIUM CHLORIDE CRYS ER 20 MEQ PO TBCR
40.0000 meq | EXTENDED_RELEASE_TABLET | Freq: Once | ORAL | Status: AC
  Administered 2011-10-09: 40 meq via ORAL
  Filled 2011-10-09: qty 2

## 2011-10-09 MED ORDER — ALPRAZOLAM 0.25 MG PO TABS: 0.2500 mg | ORAL_TABLET | Freq: Three times a day (TID) | ORAL | Status: DC | PRN

## 2011-10-09 MED ORDER — TRAMADOL HCL 50 MG PO TABS
50.0000 mg | ORAL_TABLET | ORAL | Status: DC | PRN
  Administered 2011-10-09: 50 mg via ORAL
  Filled 2011-10-09: qty 1

## 2011-10-09 NOTE — Progress Notes (Signed)
Phone call from Pt's nurse - Denny Peon.  Pt is very anxious and does not want to go home today.  I think it will be fine to keep her today.   Echo was normal.  Ambulate in halls.  If she remains stable, we will DC tomorrow.  Vesta Mixer, Montez Hageman., MD, [facility] 10/09/2011, 11:18 AM

## 2011-10-09 NOTE — Progress Notes (Signed)
Did well overnight.  Enzymes (those that she allowed to be drawn) were normal levels.  No CP or ectomy.  Knee looks fine.  OK to go home by me and expect cardiology will send home this morning.  Will follow up with me in 5-6 days.  May remove bandage at home today and use ace wrap prn. Continue ice today and tomorrow.  WBAT with and without crutches.

## 2011-10-09 NOTE — Progress Notes (Addendum)
PROGRESS NOTE  Subjective:   Pt was admitted yesterday from the surgical center after having a brief episode of asystole following anesthesia ( in the setting of nausea and vomiting).   No further nausea.  Feels better. Having some right knee tenderness.  She has been cleared from ortho standpoint and could go home.  Objective:    Vital Signs:   Temp:  [97.4 F (36.3 C)-98.3 F (36.8 C)] 98.3 F (36.8 C) (05/01 0600) Pulse Rate:  [52-103] 83  (05/01 0700) Resp:  [9-45] 19  (05/01 0700) BP: (98-150)/(54-95) 132/55 mmHg (05/01 0700) SpO2:  [96 %-100 %] 99 % (05/01 0700) FiO2 (%):  [2 %] 2 % (04/30 1600) Weight:  [161 lb 6 oz (73.2 kg)] 161 lb 6 oz (73.2 kg) (04/30 1600)  Last BM Date: 10/08/11   24-hour weight change: Weight change:   Weight trends: Filed Weights   10/03/11 1106 10/08/11 1600  Weight: 155 lb (70.308 kg) 161 lb 6 oz (73.2 kg)    Intake/Output:  04/30 0701 - 05/01 0700 In: 2510 [P.O.:240; I.V.:2270] Out: 1750 [Urine:1750]     Physical Exam: BP 132/55  Pulse 83  Temp(Src) 98.3 F (36.8 C) (Oral)  Resp 19  Ht 5' 3.5" (1.613 m)  Wt 161 lb 6 oz (73.2 kg)  BMI 28.14 kg/m2  SpO2 99%  General: Vital signs reviewed and noted. Well-developed, well-nourished, in no acute distress; alert, appropriate and cooperative throughout examination.  Head: Normocephalic, atraumatic.  Eyes: conjunctivae/corneas clear. PERRL, EOM's intact. Fundi benign.  Throat: Oropharynx nonerythematous, no exudate appreciated.   Neck: Supple. Normal carotids. No JVD  Lungs:  Clear bilaterally to auscultation without wheezes, rales, or rhonchi. Breathing is unlabored.  Heart: RR.   With normal  S1 S2. No murmurs, rubs, or gallops   Abdomen:  Soft, non-tender, non-distended with normoactive bowel sounds. No hepatomegaly. No rebound/guarding. No abdominal masses.  Extremities: No edema.  Distal pedal pulses are 2+ and equal bilaterally. Right knee bandage  Neurologic: A&O X3, CN II  - XII are grossly intact. Motor strength is 5/5 in the all 4 extremities.  Psych: Responds to questions appropriately with normal affect.    Labs: BMET:  Basename 10/08/11 1645  NA 136  K 3.4*  CL 99  CO2 22  GLUCOSE 159*  BUN 14  CREATININE 0.58  CALCIUM 9.4  MG 2.0  PHOS --    Liver function tests:  Basename 10/08/11 1645  AST 24  ALT 26  ALKPHOS 78  BILITOT 0.3  PROT 7.4  ALBUMIN 4.3   No results found for this basename: LIPASE:2,AMYLASE:2 in the last 72 hours  CBC:  Basename 10/08/11 1032  WBC --  NEUTROABS --  HGB 14.1  HCT --  MCV --  PLT --    Cardiac Enzymes:  Basename 10/08/11 2125 10/08/11 1645  CKTOTAL 49 57  CKMB 2.1 2.3  TROPONINI <0.30 <0.30    Coagulation Studies:  Basename 10/08/11 1645  LABPROT 13.2  INR 0.98     Basename 10/08/11 1645  CHOL 268*  HDL 71  LDLCALC 174*  TRIG 113  CHOLHDL 3.8    Basename 10/08/11 1645  TSH 0.669  T4TOTAL --  T3FREE --  THYROIDAB --   No results found for this basename: VITAMINB12,FOLATE,FERRITIN,TIBC,IRON,RETICCTPCT in the last 72 hours    Tele:  NSR  Medications:    Infusions:    . sodium chloride 10 mL/hr (10/09/11 0500)  . sodium chloride 10 mL/hr (10/09/11 0400)  .  DISCONTD: lactated ringers    . DISCONTD: lactated ringers      Scheduled Medications:    .  ceFAZolin (ANCEF) IV  2 g Intravenous 60 min Pre-Op  . enoxaparin  40 mg Subcutaneous Q24H  . LORazepam  0.5 mg Intravenous Q6H  . DISCONTD: chlorhexidine  60 mL Topical Once    Assessment/ Plan:    1. Asystole:  Likely due to increased vagal tone in the setting of nausea / vomiting following anesthesia.  Will watch for several hours and make sure that she tolerates breakfast and lunch.  DC to home later today if she remains stable  Disposition: DC to home this afternoon if she remains stable without nausea. I will see in the office in a month or so.   Length of Stay: 1  Vesta Mixer, Montez Hageman., MD,  [facility] 10/09/2011, 7:53 AM

## 2011-10-09 NOTE — Progress Notes (Signed)
Pt c/o about being stuck for labs (cardiac panel Q6 x3)and did not want to be stuck in the foot and requested RN to call MD to see if next lab (due 05/01 0325) could be cancelled.  Allison Hunt notified last noc, stated if cardiac enzymes remain negative that it was ok if pt refused the labs. Second set of cardiac enzyme negative at this time. Will monitor.   Pt refused 0325 lab as well as refused her scheduled ativan both at 0000 and 0600. Will cont to monitor.

## 2011-10-09 NOTE — Progress Notes (Signed)
Utilization review completed. Bayle Calvo, RN, BSN.    10/09/11  

## 2011-10-09 NOTE — Progress Notes (Signed)
Visited with pt this am and she seems to understand what  might have happened but states this happens at home.  She has had similar passing out episodes anytime she vomits.  I asked her about doing an echo and she thought that would be a good idea before she leaves.  Seemed unaware she had one on 4/30. Her anesthetic management was uneventful.  Will follow.     Thank your for your cardiac care.  Sheldon Silvan, MD

## 2011-10-10 LAB — BASIC METABOLIC PANEL
CO2: 26 mEq/L (ref 19–32)
Chloride: 103 mEq/L (ref 96–112)
Glucose, Bld: 111 mg/dL — ABNORMAL HIGH (ref 70–99)
Potassium: 4 mEq/L (ref 3.5–5.1)
Sodium: 139 mEq/L (ref 135–145)

## 2011-10-10 LAB — CBC
HCT: 36.1 % (ref 36.0–46.0)
Hemoglobin: 11.9 g/dL — ABNORMAL LOW (ref 12.0–15.0)
MCH: 29 pg (ref 26.0–34.0)
MCV: 87.8 fL (ref 78.0–100.0)
RBC: 4.11 MIL/uL (ref 3.87–5.11)
WBC: 11.8 10*3/uL — ABNORMAL HIGH (ref 4.0–10.5)

## 2011-10-10 NOTE — Discharge Summary (Signed)
CARDIOLOGY DISCHARGE SUMMARY   Patient ID: Allison Hunt MRN: [id number] DOB/AGE: 1950-07-27 61 y.o.  Admit date: 10/08/2011 Discharge date: 10/10/2011  Primary Discharge Diagnosis:  Asystole Secondary Discharge Diagnosis:  Past Medical History  Diagnosis Date  . Mitral valve prolapse   . Insomnia   . Positional vertigo   . Arthritis     osteoarthritis both knees  . Complication of anesthesia     hard to wake up  . PONV (postoperative nausea and vomiting)   . Cervical cancer 1984  . Medial meniscus tear 09/2011    right knee  . Lateral meniscal tear 09/2011    right knee  . 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 Dental crowns present   . Syncope, vasovagal 2005    in setting of N&V, pain from a ureteral stone  . Cardiac asystole   . Kidney stones 2005    Consults: Dr Jerl Santos  Procedures: 2D echocardiogram  Hospital Course: Allison Hunt is a 61 year old female with no previous history of coronary artery disease. She had a syncopal episode in 2005 in the setting of nausea and vomiting. That was felt to be vasovagal syncope. She also had an episode of near-syncope in 2011 secondary to an acute GI illness.  On the day of admission, this problem and had outpatient knee surgery. During her recovery period after surgery, she was extremely nauseated and vomiting. She had 2 periods of asystole. Strips are reviewed and showed no electrical activity. She required a brief period of CPR but did not require intubation or medications. She was transferred and admitted for further evaluation and treatment.  Her nausea was managed medically. She gradually improved from this and was started on clear liquids, progressing to a normal diet. She was followed by orthopedics during her hospital stay, and had no problems with her incision. She was recovering well from the knee surgery. She had no further episodes of syncope and no significant arrhythmia at baseline. An echocardiogram showed no  significant abnormalities.  On 10/10/2011, Allison Hunt was evaluated by Dr. Elease Hashimoto. From an orthopedic standpoint and a cardiac standpoint she was improved and considered stable for discharge, to follow up as an outpatient.    Labs:   Lab Results  Component Value Date   WBC 11.8* 10/10/2011   HGB 11.9* 10/10/2011   HCT 36.1 10/10/2011   MCV 87.8 10/10/2011   PLT 289 10/10/2011    Lab 10/10/11 0530 10/08/11 1645  NA 139 --  K 4.0 --  CL 103 --  CO2 26 --  BUN 14 --  CREATININE 0.64 --  CALCIUM 8.5 --  PROT -- 7.4  BILITOT -- 0.3  ALKPHOS -- 78  ALT -- 26  AST -- 24  GLUCOSE 111* --    Basename 10/08/11 2125 10/08/11 1645  CKTOTAL 49 57  CKMB 2.1 2.3  CKMBINDEX -- --  TROPONINI <0.30 <0.30   Lipid Panel     Component Value Date/Time   CHOL 268* 10/08/2011 1645   TRIG 113 10/08/2011 1645   HDL 71 10/08/2011 1645   CHOLHDL 3.8 10/08/2011 1645   VLDL 23 10/08/2011 1645   LDLCALC 174* 10/08/2011 1645    No results found for this basename: probnp    Basename 10/08/11 1645  INR 0.98      Radiology:  Portable Chest X-ray 1 View 10/08/2011  *RADIOLOGY REPORT*  Clinical Data: Status post CPR.  PORTABLE CHEST - 1 VIEW  Comparison: No priors.  Findings:  Lung volumes are normal.  Linear opacities in the left lower lobe likely reflect subsegmental atelectasis.  No focal airspace consolidation.  No pleural effusions.  Pulmonary vasculature and the cardiomediastinal silhouette are within normal limits.  No pneumothorax.  IMPRESSION:  1.  Subsegmental atelectasis in the left lower lobe.  Otherwise, no radiographic evidence of acute cardiopulmonary disease.  Original Report Authenticated By: Florencia Reasons, M.D.   EKG: 09-Oct-2011 06:58:21 Normal sinus rhythm Normal ECG Vent. rate 76 BPM PR interval 156 ms QRS duration 100 ms QT/QTc 400/450 ms P-R-T axes 87 55 58  Echo: 10/08/2011 Study Conclusions - Left ventricle: The cavity size was normal. Wall thickness was increased in a  pattern of mild LVH. Systolic function was normal. The estimated ejection fraction was in the range of 60% to 65%. Wall motion was normal; there were no regional wall motion abnormalities. - Aortic valve: There was no stenosis. - Mitral valve: Trivial regurgitation. - Right ventricle: The cavity size was normal. Systolic function was normal. - Pulmonary arteries: PA peak pressure: 33mm Hg (S). - Inferior vena cava: The vessel was normal in size; the respirophasic diameter changes were in the normal range (= 50%); findings are consistent with normal central venous pressure. - Pericardium, extracardiac: A trivial pericardial effusion was identified posterior to the heart. Impressions: - Normal LV size with mild LV hypertrophy. EF 60-65%. Normal RV size and systolic function. No significant valvular abnormalities.    FOLLOW UP PLANS AND APPOINTMENTS Discharge Orders    Future Appointments: Provider: Department: Dept Phone: Center:   12/02/2011 11:30 AM Vesta Mixer, MD [facility] [phone] None     Future Orders Please Complete By Expires   DME Crutches      Diet - low sodium heart healthy      Call MD / Call 911      Comments:   If you experience chest pain or shortness of breath, CALL 911 and be transported to the hospital emergency room.  If you develope a fever above 101 F, pus (white drainage) or increased drainage or redness at the wound, or calf pain, call your surgeon's office.   Constipation Prevention      Comments:   Drink plenty of fluids.  Prune juice may be helpful.  You may use a stool softener, such as Colace (over the counter) 100 mg twice a day.  Use MiraLax (over the counter) for constipation as needed.   Increase activity slowly as tolerated      Weight Bearing as taught in Physical Therapy      Comments:   Use a walker or crutches as instructed.     Allergies  Allergen Reactions  . Demerol Nausea Only   Medication List  As of 10/10/2011  8:58  AM   TAKE these medications         calcium carbonate 600 MG Tabs   Commonly known as: OS-CAL   Take 600 mg by mouth 2 (two) times daily with a meal.      cholecalciferol 1000 UNITS tablet   Commonly known as: VITAMIN D   Take 1,000 Units by mouth daily.      diclofenac 75 MG EC tablet   Commonly known as: VOLTAREN   Take 75 mg by mouth 2 (two) times daily.      traMADol 50 MG tablet   Commonly known as: ULTRAM   Take 1 tablet (50 mg total) by mouth every 6 (six) hours as needed for pain.  zaleplon 10 MG capsule   Commonly known as: SONATA   Take 10 mg by mouth at bedtime.           Follow-up Information    Follow up with DALLDORF,PETER G, MD in 10 days.   Contact information:   [address] [place] [address] [phone]       Follow up with Elyn Aquas., MD on 12/02/2011. (11:30 AM)    Contact information:   [address] [place] [address] [phone]          BRING ALL MEDICATIONS WITH YOU TO FOLLOW UP APPOINTMENTS  Time spent with patient to include physician time: 36 min Signed: Theodore Demark 10/10/2011, 8:58 AM Co-Sign MD  Attending Note:   The patient was seen and examined.  Agree with assessment and plan as noted above.  See my note from earlier today.  Vesta Mixer, Montez Hageman., MD, [facility] 10/10/2011, 12:25 PM

## 2011-10-10 NOTE — Progress Notes (Signed)
Pt woke up feeling nauseous, dry heaving, and was diaphoretic. Pt's HR dropped to 40's with new AV block, rhythm strip placed in shadow chart.  Symptoms and dysrhythmia lasted approximately 3-4 minutes. Pt was given 4mg  of Zofran and symptoms subsided.  BP 137/77, HR 67, 96% RA, pt back in NSR.    Pt continues to be in NSR and "feeling back to normal," will continue to monitor closely.

## 2011-10-10 NOTE — Progress Notes (Signed)
PROGRESS NOTE  Subjective:   Pt was admitted Tuesday from the surgical center after having a brief episode of asystole following anesthesia ( in the setting of nausea and vomiting).   No further nausea.  Feels better. Having some right knee tenderness.  She has been cleared from ortho standpoint and could go home.  She had some nausea early this AM associated with transient AV block.  asymptomatic  Objective:    Vital Signs:   Temp:  [97.8 F (36.6 C)-98.8 F (37.1 C)] 98.4 F (36.9 C) (05/02 0400) Pulse Rate:  [73-86] 73  (05/02 0400) Resp:  [15-18] 17  (05/02 0400) BP: (133-163)/(68-84) 133/68 mmHg (05/02 0400) SpO2:  [96 %-100 %] 100 % (05/02 0400)  Last BM Date: 10/08/11   24-hour weight change: Weight change:   Weight trends: Filed Weights   10/03/11 1106 10/08/11 1600  Weight: 155 lb (70.308 kg) 161 lb 6 oz (73.2 kg)    Intake/Output:  05/01 0701 - 05/02 0700 In: 170 [P.O.:120; I.V.:50] Out: 400 [Urine:400]     Physical Exam: BP 133/68  Pulse 73  Temp(Src) 98.4 F (36.9 C) (Oral)  Resp 17  Ht 5' 3.5" (1.613 m)  Wt 161 lb 6 oz (73.2 kg)  BMI 28.14 kg/m2  SpO2 100%  General: Vital signs reviewed and noted. Well-developed, well-nourished, in no acute distress; alert, appropriate and cooperative throughout examination.  Head: Normocephalic, atraumatic.  Eyes: conjunctivae/corneas clear. PERRL, EOM's intact. Fundi benign.  Throat: Oropharynx nonerythematous, no exudate appreciated.   Neck: Supple. Normal carotids. No JVD  Lungs:  Clear bilaterally to auscultation without wheezes, rales, or rhonchi. Breathing is unlabored.  Heart: RR.   With normal  S1 S2. No murmurs, rubs, or gallops   Abdomen:  Soft, non-tender, non-distended with normoactive bowel sounds. No hepatomegaly. No rebound/guarding. No abdominal masses.  Extremities: No edema.  Distal pedal pulses are 2+ and equal bilaterally. Right knee bandage  Neurologic: A&O X3, CN II - XII are grossly  intact. Motor strength is 5/5 in the all 4 extremities.  Psych: Responds to questions appropriately with normal affect.    Labs: BMET:  Basename 10/10/11 0530 10/08/11 1645  NA 139 136  K 4.0 3.4*  CL 103 99  CO2 26 22  GLUCOSE 111* 159*  BUN 14 14  CREATININE 0.64 0.58  CALCIUM 8.5 9.4  MG -- 2.0  PHOS -- --    Liver function tests:  Basename 10/08/11 1645  AST 24  ALT 26  ALKPHOS 78  BILITOT 0.3  PROT 7.4  ALBUMIN 4.3   No results found for this basename: LIPASE:2,AMYLASE:2 in the last 72 hours  CBC:  Basename 10/10/11 0530 10/08/11 1032  WBC 11.8* --  NEUTROABS -- --  HGB 11.9* 14.1  HCT 36.1 --  MCV 87.8 --  PLT 289 --    Cardiac Enzymes:  Basename 10/08/11 2125 10/08/11 1645  CKTOTAL 49 57  CKMB 2.1 2.3  TROPONINI <0.30 <0.30    Coagulation Studies:  Basename 10/08/11 1645  LABPROT 13.2  INR 0.98     Basename 10/08/11 1645  CHOL 268*  HDL 71  LDLCALC 174*  TRIG 113  CHOLHDL 3.8    Basename 10/08/11 1645  TSH 0.669  T4TOTAL --  T3FREE --  THYROIDAB --   No results found for this basename: VITAMINB12,FOLATE,FERRITIN,TIBC,IRON,RETICCTPCT in the last 72 hours    Tele:  NSR, transient AV block with episode of nausea  Medications:    Infusions:    .  sodium chloride 10 mL/hr (10/09/11 0500)  . DISCONTD: sodium chloride 10 mL/hr (10/09/11 0400)    Scheduled Medications:    . enoxaparin  40 mg Subcutaneous Q24H  . potassium chloride  40 mEq Oral Once  . DISCONTD: LORazepam  0.5 mg Intravenous Q6H    Assessment/ Plan:    1. Asystole:  Likely due to increased vagal tone in the setting of nausea / vomiting following anesthesia.    Her echo is normal .  I think she can go home today.    Disposition: we will see in the office in several weeks.  She can see our NPs - Norma Fredrickson or Ward Givens and then see me in 3 months.  Length of Stay: 2  Alvia Grove., MD, [facility] 10/10/2011, 8:07 AM

## 2011-10-14 ENCOUNTER — Encounter (HOSPITAL_COMMUNITY): Payer: Self-pay

## 2011-11-08 ENCOUNTER — Ambulatory Visit (INDEPENDENT_AMBULATORY_CARE_PROVIDER_SITE_OTHER): Payer: BC Managed Care – PPO | Admitting: Nurse Practitioner

## 2011-11-08 ENCOUNTER — Encounter: Payer: Self-pay | Admitting: Nurse Practitioner

## 2011-11-08 VITALS — BP 126/70 | HR 88 | Ht 63.0 in | Wt 152.0 lb

## 2011-11-08 DIAGNOSIS — R55 Syncope and collapse: Secondary | ICD-10-CM

## 2011-11-08 NOTE — Patient Instructions (Signed)
Your physician wants you to follow-up in: 6 months with Dr Taylor You will receive a reminder letter in the mail two months in advance. If you don't receive a letter, please call our office to schedule the follow-up appointment.  

## 2011-11-08 NOTE — Progress Notes (Signed)
Patient Name: Allison Hunt Date of Encounter: 11/08/2011  Primary Care Provider:  Bradd Burner, PA, PA Primary Cardiologist:  Reece Agar. Ladona Ridgel, MD  Patient Profile  61 y/o female with h/o high vagal tone and vasovagal syncope who presents for f/u.  Problem List   Past Medical History  Diagnosis Date  . Insomnia   . Positional vertigo   . Arthritis     osteoarthritis both knees  . Complication of anesthesia     a. Vasovagal syncope with bradycardia & hypotension  . PONV (postoperative nausea and vomiting)   . Cervical cancer 1984  . Medial meniscus tear 09/2011    a. right knee  . Lateral meniscal tear 09/2011    a. right knee  . 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 right knee - s/p Knee surgery 09/2011  . Dental crowns present   . Syncope, vasovagal 2005    a. exacerbated by pain/surgical procedures  . Cardiac asystole     a.  Felt to be 2/2 high vagal tone in setting of knee surgery;  b. 09/2011 Echo: EF 60-65%.  . Kidney stones 2005  . Paroxysmal atrial fibrillation     a. 09/2011 in setting of post-op knee   Past Surgical History  Procedure Date  . Total abdominal hysterectomy 1991    due to cervical cancer  . Patent ductus arterious repair 1956  . Knee arthroscopy 10/08/2011    Procedure: ARTHROSCOPY KNEE;  Surgeon: Velna Ochs, MD;  Location: [facility];  Service: Orthopedics;  Laterality: Right;  right knee arthroscopy partial medial and lateral menisectomy and chondroplasty    Allergies  Allergies  Allergen Reactions  . Demerol Nausea Only    HPI  61 y/o female with the above problem list.  In April of this year, pt was having an outpt r knee surgery.  Post-op, she complained of not feeling well and this was followed by a brief period of asystole and hypotension and then subsequently a brief run of rapid afib, which was self-limiting.  She was admitted to [facility] and had no further arrhythmias.  Echo was normal.  The episode was felt  to be 2/2 high vasovagal tone (seen by EP).  Since d/c, she did have 1 episode of presyncope in the setting of intense right knee pain.  Otherwise, she has been doing well and tolerating P.T.  Home Medications  Prior to Admission medications   Medication Sig Start Date End Date Taking? Authorizing Provider  calcium carbonate (OS-CAL) 600 MG TABS Take 600 mg by mouth 2 (two) times daily with a meal.   Yes Historical Provider, MD  cholecalciferol (VITAMIN D) 1000 UNITS tablet Take 1,000 Units by mouth daily.   Yes Historical Provider, MD  traMADol (ULTRAM) 50 MG tablet Take 50 mg by mouth every 6 (six) hours as needed.   Yes Historical Provider, MD  zaleplon (SONATA) 10 MG capsule Take 10 mg by mouth at bedtime as needed.    Yes Historical Provider, MD    Review of Systems  Right knee pain.  Otherwise all other systems reviewed and are otherwise negative except as noted above.  Physical Exam  Blood pressure 126/70, pulse 88, height 5\' 3"  (1.6 m), weight 152 lb (68.947 kg).  General: Pleasant, NAD Psych: Normal affect. Neuro: Alert and oriented X 3. Moves all extremities spontaneously. HEENT: Normal  Neck: Supple without bruits or JVD. Lungs:  Resp regular and unlabored, CTA. Heart: RRR no s3, s4, or  murmurs. Abdomen: Soft, non-tender, non-distended, BS + x 4.  Extremities: No clubbing, cyanosis or edema. DP/PT/Radials 2+ and equal bilaterally.  Assessment & Plan  1.  Vasovagal Syncope/High Vagal Tone:  In setting of knee surgery in 09/2011.  Had similar episodes with intense pain or surgical procedures in past.  At some point, she plans to have her left knee operated on - perhaps next year.  She should likely have any future surgeries in the hospital, rather than an outpt surgi-center.  We'll arrange f/u with Dr. Ladona Ridgel in 6 months.  Nicolasa Ducking, NP 11/08/2011, 2:10 PM

## 2011-12-02 ENCOUNTER — Encounter: Payer: BC Managed Care – PPO | Admitting: Cardiovascular Disease

## 2012-05-19 ENCOUNTER — Encounter: Payer: Self-pay | Admitting: Internal Medicine

## 2012-05-19 ENCOUNTER — Ambulatory Visit (INDEPENDENT_AMBULATORY_CARE_PROVIDER_SITE_OTHER): Payer: BC Managed Care – PPO | Admitting: Internal Medicine

## 2012-05-19 VITALS — BP 141/82 | HR 68 | Ht 63.0 in | Wt 143.3 lb

## 2012-05-19 DIAGNOSIS — E785 Hyperlipidemia, unspecified: Secondary | ICD-10-CM | POA: Insufficient documentation

## 2012-05-19 DIAGNOSIS — R55 Syncope and collapse: Secondary | ICD-10-CM

## 2012-05-19 NOTE — Progress Notes (Signed)
HPI Allison Hunt returns today for followup. She is a very pleasant 61 year old woman who I met several months ago after she was taken to [facility] following orthopedic surgery. In the recovery area, the patient experienced nausea and then apnea, profound bradycardia, requiring CPR. She experienced 2 distinct episodes, each approximately 10 minutes apart. She is a history of prior episodes of bradycardia associated with syncope. These are always related to medical procedures or the use of Demerol. Since discharge from the hospital, she has had no recurrent syncope. She denies chest pain or shortness of breath. The patient has multiple questions today regarding hyperlipidemia. Allergies  Allergen Reactions  . Demerol Nausea Only     Current Outpatient Prescriptions  Medication Sig Dispense Refill  . calcium carbonate (OS-CAL) 600 MG TABS Take 600 mg by mouth 2 (two) times daily with a meal.      . cholecalciferol (VITAMIN D) 1000 UNITS tablet Take 1,000 Units by mouth daily.      . diclofenac (VOLTAREN) 75 MG EC tablet Take 75 mg by mouth 2 (two) times daily.      . traMADol (ULTRAM) 50 MG tablet Take 50 mg by mouth every 6 (six) hours as needed.      . zaleplon (SONATA) 10 MG capsule Take 10 mg by mouth at bedtime as needed.          Past Medical History  Diagnosis Date  . Insomnia   . Positional vertigo   . Arthritis     osteoarthritis both knees  . Complication of anesthesia     a. Vasovagal syncope with bradycardia & hypotension  . PONV (postoperative nausea and vomiting)   . Cervical cancer 1984  . Medial meniscus tear 09/2011    a. right knee  . Lateral meniscal tear 09/2011    a. right knee  . 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 right knee - s/p Knee surgery 09/2011  . Dental crowns present   . Syncope, vasovagal 2005    a. exacerbated by pain/surgical procedures  . Cardiac asystole     a.  Felt to be 2/2 high vagal tone in setting of knee surgery;  b. 09/2011 Echo: EF  60-65%.  . Kidney stones 2005  . Paroxysmal atrial fibrillation     a. 09/2011 in setting of post-op knee    ROS:   All systems reviewed and negative except as noted in the HPI.   Past Surgical History  Procedure Date  . Total abdominal hysterectomy 1991    due to cervical cancer  . Patent ductus arterious repair 1956  . Knee arthroscopy 10/08/2011    Procedure: ARTHROSCOPY KNEE;  Surgeon: Velna Ochs, MD;  Location: [facility];  Service: Orthopedics;  Laterality: Right;  right knee arthroscopy partial medial and lateral menisectomy and chondroplasty     Family History  Problem Relation Age of Onset  . Anemia Father   . Heart attack Father   . Pulmonary embolism Father   . Alzheimer's disease Mother   . Lupus Sister   . Aortic stenosis Son     with AVR at 51 y/o     History   Social History  . Marital Status: Single    Spouse Name: N/A    Number of Children: N/A  . Years of Education: N/A   Occupational History  . Flight attendant    Social History Main Topics  . Smoking status: Former Games developer  . Smokeless tobacco: Never Used  Comment: quit smoking 22 years ago  . Alcohol Use: Yes     Comment: 4-5 x/week  . Drug Use: No  . Sexually Active:    Other Topics Concern  . Not on file   Social History Narrative         BP 141/82  Pulse 68  Ht 5\' 3"  (1.6 m)  Wt 143 lb 4.8 oz (65 kg)  BMI 25.38 kg/m2  Physical Exam:  Well appearing middle-aged woman, NAD HEENT: Unremarkable Neck:  No JVD, no thyromegally Lungs:  Clear with no wheezes, rales, or rhonchi. HEART:  Regular rate rhythm, no murmurs, no rubs, no clicks Abd:  soft, positive bowel sounds, no organomegally, no rebound, no guarding Ext:  2 plus pulses, no edema, no cyanosis, no clubbing Skin:  No rashes no nodules Neuro:  CN II through XII intact, motor grossly intact  EKG Normal sinus rhythm  Assess/Plan:

## 2012-05-19 NOTE — Assessment & Plan Note (Signed)
She has fairly high LDL cholesterol and normal HDL cholesterol. She is a family history of coronary disease. The patient is reluctant to take atorvastatin. I've recommended that she start, and recheck her lipids in 2-3 months. She will followup with her primary physician.

## 2012-05-19 NOTE — Patient Instructions (Signed)
Your physician recommends that you schedule a follow-up appointment in: 6 months with Dr Ladona Ridgel  Your physician has recommended you make the following change in your medication:  1) Start taking your Lipitor

## 2012-05-19 NOTE — Assessment & Plan Note (Signed)
We discussed the mechanism of her autonomic dysfunction. For the most part she is quite stable, she has a very noxious stimuli. Placed to avoid these noxious stimuli have been discussed with the patient. I've recommended no additional medical therapy. She will undergo watchful waiting.

## 2012-09-04 ENCOUNTER — Encounter: Payer: Self-pay | Admitting: Cardiovascular Disease

## 2012-12-03 ENCOUNTER — Encounter: Payer: Self-pay | Admitting: Internal Medicine

## 2012-12-07 ENCOUNTER — Encounter: Payer: Self-pay | Admitting: *Deleted

## 2013-02-05 ENCOUNTER — Encounter: Payer: Self-pay | Admitting: Internal Medicine

## 2013-02-05 ENCOUNTER — Ambulatory Visit (INDEPENDENT_AMBULATORY_CARE_PROVIDER_SITE_OTHER): Payer: 59 | Admitting: Internal Medicine

## 2013-02-05 VITALS — BP 132/70 | HR 60 | Ht 63.0 in | Wt 148.8 lb

## 2013-02-05 DIAGNOSIS — Z1211 Encounter for screening for malignant neoplasm of colon: Secondary | ICD-10-CM

## 2013-02-05 NOTE — Progress Notes (Signed)
Allison Hunt 12-07-1950 MRN [id number]   History of Present Illness:  This is a 62 year old white female who is here to discuss a recall colonoscopy. She has a positive family history of colon cancer in her maternal grandmother. Father had acolon resection for unknown reasons. She remains asymptomatic. She has a history of severe hypotension and bradycardia resulting in cardiac arrest post operatively aftera knee surgery. She also had severe hypotension after the first colonoscopy in 2003 in [place] and after the second colonoscopy in 2008. She never had any polyps. Her bowel habits are regular. She denies rectal bleeding. 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  Past Medical History  Diagnosis Date  . Insomnia   . Positional vertigo   . Arthritis     osteoarthritis both knees  . Complication of anesthesia     a. Vasovagal syncope with bradycardia & hypotension  . PONV (postoperative nausea and vomiting)   . Cervical cancer 1984  . Medial meniscus tear 09/2011    a. right knee  . Lateral meniscal tear 09/2011    a. right knee  . 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 right knee - s/p Knee surgery 09/2011  . Dental crowns present   . Syncope, vasovagal 2005    a. exacerbated by pain/surgical procedures  . Cardiac asystole     a.  Felt to be 2/2 high vagal tone in setting of knee surgery;  b. 09/2011 Echo: EF 60-65%.  . Kidney stones 2005  . Paroxysmal atrial fibrillation     a. 09/2011 in setting of post-op knee  . Liver hemangioma   . Gallbladder polyp   . Aortic sclerosis   . Mitral valve prolapse    Past Surgical History  Procedure Laterality Date  . Total abdominal hysterectomy  1991    due to cervical cancer  . Patent ductus arterious repair  1956  . Knee arthroscopy  10/08/2011    Procedure: ARTHROSCOPY KNEE;  Surgeon: Velna Ochs, MD;  Location: [facility];  Service: Orthopedics;  Laterality: Right;  right knee arthroscopy partial  medial and lateral menisectomy and chondroplasty  . Kidney stone surgery      reports that she has quit smoking. She has never used smokeless tobacco. She reports that  drinks alcohol. She reports that she does not use illicit drugs. family history includes Alzheimer's disease in her mother; Anemia in her father; Aortic stenosis in her son; Colon cancer in her father and maternal grandmother; Dementia in her paternal grandmother; Heart attack in her father; Lupus in her sister; Pulmonary embolism in her father; Suicidality in her paternal grandfather. Allergies  Allergen Reactions  . Demerol Nausea Only        Review of Systems: Denies chest pain dysphagia abdominal pain  The remainder of the 10 point ROS is negative except as outlined in H&P   Physical Exam: General appearance  Well developed, in no distress. Lungs Clear to auscultation bilaterally. Cor normal S1, normal S2, regular rhythm, no murmur,  quiet precordium. Psychological normal mood and affect.  Assessment and Plan:  Problem #70 62 year old white female who had a hypotensive reaction after general anesthesia as well as after conscious sedation. She has  a family history of colon cancer in an indirect relative but not in her father as listed in her history from elsewhere. Her father had a  Colon resection for an unknown reason.. According to the guidelines, the suggested recall colonoscopy interval is 10  years. Her last colonoscopy was in August 2008 and that was a normal exam. I suggested based on her history of intolerance to sedatives,  to wait 10 years before the next colonoscopy.from the date of the last colonoscopy. When doing the next colonoscopy, the procedure will have to be done in the hospital setting with the CRNA in attendance. Another alternative would be to use  virtual colonoscopy as a substitute for coloscopy to avoid sedation. She would prefer that alternative although  a colonoscopy may be necessary in the case  of a polyp. We will send her Hemoccult cards and schedule her for a recall colonoscopy for August 2018.   02/05/2013 Allison Hunt

## 2013-02-05 NOTE — Patient Instructions (Addendum)
Ch,Gurley PA, Dr Sharrell Ku

## 2013-03-02 ENCOUNTER — Telehealth: Payer: Self-pay | Admitting: Internal Medicine

## 2013-03-02 NOTE — Telephone Encounter (Signed)
Recd records from [facility], Forwarding 7 pages to Dr.Brodie

## 2013-04-28 ENCOUNTER — Other Ambulatory Visit: Payer: Self-pay | Admitting: Internal Medicine

## 2013-04-28 ENCOUNTER — Other Ambulatory Visit: Payer: 59

## 2013-04-28 ENCOUNTER — Other Ambulatory Visit (INDEPENDENT_AMBULATORY_CARE_PROVIDER_SITE_OTHER): Payer: 59

## 2013-04-28 DIAGNOSIS — Z1211 Encounter for screening for malignant neoplasm of colon: Secondary | ICD-10-CM

## 2013-05-03 LAB — HEMOCCULT SLIDES (X 3 CARDS)
Fecal Occult Blood: NEGATIVE
OCCULT 2: NEGATIVE
OCCULT 5: NEGATIVE

## 2013-12-14 ENCOUNTER — Other Ambulatory Visit: Payer: Self-pay | Admitting: Orthopaedic Surgery

## 2013-12-23 ENCOUNTER — Encounter: Payer: Self-pay | Admitting: Physician Assistant

## 2013-12-23 ENCOUNTER — Ambulatory Visit (INDEPENDENT_AMBULATORY_CARE_PROVIDER_SITE_OTHER): Payer: BC Managed Care – PPO | Admitting: Physician Assistant

## 2013-12-23 VITALS — BP 142/60 | HR 72 | Ht 63.0 in | Wt 153.0 lb

## 2013-12-23 DIAGNOSIS — Z01810 Encounter for preprocedural cardiovascular examination: Secondary | ICD-10-CM

## 2013-12-23 DIAGNOSIS — R55 Syncope and collapse: Secondary | ICD-10-CM

## 2013-12-23 NOTE — Progress Notes (Signed)
Cardiology Office Note    Date:  12/23/2013   ID:  Dena Esperanza, DOB April 05, 1951, MRN [id number]  PCP:  Namon Cirri  Cardiologist:  Dr. Liam Rogers  Electrophysiologist:  Dr. Cristopher Peru      History of Present Illness: Allison Hunt is a 63 y.o. female with a hx of high vagal tone and vasovagal syncope, HL.  History includes period of asystole and hypotension (required CPR) with subsequent brief run of AFib with RVR after outpatient knee surgery in 2013.  She has had similar episodes with intense pain or surgical procedures in the past.  Last seen by Dr. Cristopher Peru in 05/2012.    She needs L knee arthroscopic surgery for a torn meniscus with Dr. Rhona Raider and presents for surgical clearance.  OR is set for 7/28 and it will be done in the hospital.  She is fairly limited by her knee pain.  She cannot navigate steps or hills.  She has a long hx of atypical chest pain.  It has not changed over many years.  She notes a sharp discomfort that only lasts seconds.  It is not related to exertion.  She can likely achieve 4 METs.  She denies significant dyspnea.  She is likely NYHA 2.  She denies orthopnea, PND, edema.  She denies syncope.    Studies:  - Echo (10/08/11):  Mild LVH, EF 60-65%, normal wall motion, trivial MR, normal RV function, PASP 33 mm Hg, trivial effusion    Recent Labs: No results found for requested labs within last 365 days.  Wt Readings from Last 3 Encounters:  12/23/13 153 lb (69.4 kg)  02/05/13 148 lb 12.8 oz (67.495 kg)  05/19/12 143 lb 4.8 oz (65 kg)     Past Medical History  Diagnosis Date  . Insomnia   . Positional vertigo   . Arthritis     osteoarthritis both knees  . Complication of anesthesia     a. Vasovagal syncope with bradycardia & hypotension  . PONV (postoperative nausea and vomiting)   . Cervical cancer 1984  . Medial meniscus tear 09/2011    a. right knee  . Lateral meniscal tear 09/2011    a. right knee  . Chondromalacia of  knee 09/2011    a. right knee - s/p Knee surgery 09/2011  . Dental crowns present   . Syncope, vasovagal 2005    a. exacerbated by pain/surgical procedures  . Cardiac asystole     a.  Felt to be 2/2 high vagal tone in setting of knee surgery;  b. 09/2011 Echo: EF 60-65%.  . Kidney stones 2005  . Paroxysmal atrial fibrillation     a. 09/2011 in setting of post-op knee  . Liver hemangioma   . Gallbladder polyp   . Aortic sclerosis   . Mitral valve prolapse     Current Outpatient Prescriptions  Medication Sig Dispense Refill  . atorvastatin (LIPITOR) 10 MG tablet Take 10 mg by mouth daily at 6 PM. Bed-time      . calcium carbonate (OS-CAL) 600 MG TABS Take 600 mg by mouth 2 (two) times daily with a meal.      . cholecalciferol (VITAMIN D) 1000 UNITS tablet Take 1,000 Units by mouth daily.      . diclofenac (VOLTAREN) 75 MG EC tablet Take 75 mg by mouth 2 (two) times daily.      Marland Kitchen PREVIDENT 0.2 % SOLN Take by mouth daily. Sensitive teeth      . zaleplon (  SONATA) 10 MG capsule Take 10 mg by mouth at bedtime as needed.        No current facility-administered medications for this visit.    Allergies:   Demerol   Social History:  The patient  reports that she has quit smoking. She has never used smokeless tobacco. She reports that she drinks alcohol. She reports that she does not use illicit drugs.   Family History:  The patient's family history includes Alzheimer's disease in her mother; Anemia in her father; Aortic stenosis in her son; Colon cancer in her father and maternal grandmother; Dementia in her paternal grandmother; Heart attack in her father; Lupus in her sister; Pulmonary embolism in her father; Suicidality in her paternal grandfather.   ROS:  Please see the history of present illness.      All other systems reviewed and negative.   PHYSICAL EXAM: VS:  BP 142/60  Pulse 72  Ht 5\' 3"  (1.6 m)  Wt 153 lb (69.4 kg)  BMI 27.11 kg/m2 Well nourished, well developed, in no acute  distress HEENT: normal Neck: no JVD Cardiac:  normal S1, S2; RRR; no murmur Lungs:  clear to auscultation bilaterally, no wheezing, rhonchi or rales Abd: soft, nontender, no hepatomegaly Ext: no edema Skin: warm and dry Neuro:  CNs 2-12 intact, no focal abnormalities noted  EKG:  NSR, HR 72, normal axis, no change from prior tracing    ASSESSMENT AND PLAN:  1. Pre-operative cardiovascular examination:  She does not have any unstable cardiac conditions. She does not require any further cardiac testing prior to her noncardiac surgery.  She should be acceptable risk. 2. Syncope, vasovagal:  Given her prior history, caution should be used with anesthetics and paralytics as well as pain control. Our service is available in the perioperative period as necessary. 3. Disposition: Followup with Dr. Acie Fredrickson in 6 months.   Signed, Versie Starks, MHS 12/23/2013 2:32 PM    [facility] [address], [place], [place]  [address] Phone: [phone]; Fax: [phone]

## 2013-12-23 NOTE — Patient Instructions (Signed)
Your physician recommends that you continue on your current medications as directed. Please refer to the Current Medication list given to you today.  Your physician wants you to follow-up in: 6 MONTHS WITH DR NAHSER  You will receive a reminder letter in the mail two months in advance. If you don't receive a letter, please call our office to schedule the follow-up appointment.  

## 2013-12-24 ENCOUNTER — Encounter (HOSPITAL_COMMUNITY): Payer: Self-pay | Admitting: Pharmacy Technician

## 2013-12-27 ENCOUNTER — Encounter (HOSPITAL_COMMUNITY)
Admission: RE | Admit: 2013-12-27 | Discharge: 2013-12-27 | Disposition: A | Discharge status: Home / Self Care | Payer: BC Managed Care – PPO | Source: Ambulatory Visit | Attending: Orthopaedic Surgery | Admitting: Orthopaedic Surgery

## 2013-12-27 ENCOUNTER — Ambulatory Visit (HOSPITAL_COMMUNITY)
Admission: RE | Admit: 2013-12-27 | Discharge: 2013-12-27 | Disposition: A | Discharge status: Home / Self Care | Payer: BC Managed Care – PPO | Source: Ambulatory Visit | Attending: Anesthesiology | Admitting: Anesthesiology

## 2013-12-27 ENCOUNTER — Encounter (HOSPITAL_COMMUNITY): Payer: Self-pay

## 2013-12-27 DIAGNOSIS — I059 Rheumatic mitral valve disease, unspecified: Secondary | ICD-10-CM | POA: Insufficient documentation

## 2013-12-27 DIAGNOSIS — Z01818 Encounter for other preprocedural examination: Secondary | ICD-10-CM | POA: Insufficient documentation

## 2013-12-27 HISTORY — DX: Fibromyalgia: M79.7

## 2013-12-27 HISTORY — DX: Shortness of breath: R06.02

## 2013-12-27 LAB — COMPREHENSIVE METABOLIC PANEL
ALK PHOS: 86 U/L (ref 39–117)
ALT: 13 U/L (ref 0–35)
ANION GAP: 15 (ref 5–15)
AST: 17 U/L (ref 0–37)
Albumin: 4.3 g/dL (ref 3.5–5.2)
BUN: 18 mg/dL (ref 6–23)
CALCIUM: 9.3 mg/dL (ref 8.4–10.5)
CO2: 26 meq/L (ref 19–32)
Chloride: 100 mEq/L (ref 96–112)
Creatinine, Ser: 0.64 mg/dL (ref 0.50–1.10)
GLUCOSE: 93 mg/dL (ref 70–99)
Potassium: 4 mEq/L (ref 3.7–5.3)
SODIUM: 141 meq/L (ref 137–147)
TOTAL PROTEIN: 7.6 g/dL (ref 6.0–8.3)
Total Bilirubin: 0.3 mg/dL (ref 0.3–1.2)

## 2013-12-27 LAB — CBC
HCT: 40 % (ref 36.0–46.0)
Hemoglobin: 13.2 g/dL (ref 12.0–15.0)
MCH: 29.5 pg (ref 26.0–34.0)
MCHC: 33 g/dL (ref 30.0–36.0)
MCV: 89.3 fL (ref 78.0–100.0)
PLATELETS: 318 10*3/uL (ref 150–400)
RBC: 4.48 MIL/uL (ref 3.87–5.11)
RDW: 13.3 % (ref 11.5–15.5)
WBC: 8.8 10*3/uL (ref 4.0–10.5)

## 2013-12-27 NOTE — Pre-Procedure Instructions (Signed)
Gerilyn Stargell  12/27/2013   Your procedure is scheduled on: Tuesday, January 04, 2014  Report to [facility] Admitting at 11:30 AM.  Call this number if you have problems the morning of surgery: [phone]   Remember:   Do not eat food or drink liquids after midnight Monday, January 03, 2014   Take these medicines the morning of surgery with A SIP OF WATER: If needed: traMADol (ULTRAM)  For moderate pain. Stop taking Aspirin, vitamins, and herbal medications. Do not take any NSAIDs ie: Ibuprofen, Advil, Naproxen or any medication containing Aspirin (diclofenac (VOLTAREN); stop now.   Do not wear jewelry, make-up or nail polish.  Do not wear lotions, powders, or perfumes. You may wear deodorant.  Do not shave 48 hours prior to surgery.   Do not bring valuables to the hospital.  [facility] is not responsible  for any belongings or valuables.               Contacts, dentures or bridgework may not be worn into surgery.  Leave suitcase in the car. After surgery it may be brought to your room.  For patients admitted to the hospital, discharge time is determined by your treatment team.               Patients discharged the day of surgery will not be allowed to drive home.  Name and phone number of your driver:   Special Instructions:  Special Instructions:Special Instructions: [facility] - Preparing for Surgery  Before surgery, you can play an important role.  Because skin is not sterile, your skin needs to be as free of germs as possible.  You can reduce the number of germs on you skin by washing with CHG (chlorahexidine gluconate) soap before surgery.  CHG is an antiseptic cleaner which kills germs and bonds with the skin to continue killing germs even after washing.  Please DO NOT use if you have an allergy to CHG or antibacterial soaps.  If your skin becomes reddened/irritated stop using the CHG and inform your nurse when you arrive at Short Stay.  Do not shave (including  legs and underarms) for at least 48 hours prior to the first CHG shower.  You may shave your face.  Please follow these instructions carefully:   1.  Shower with CHG Soap the night before surgery and the morning of Surgery.  2.  If you choose to wash your hair, wash your hair first as usual with your normal shampoo.  3.  After you shampoo, rinse your hair and body thoroughly to remove the Shampoo.  4.  Use CHG as you would any other liquid soap.  You can apply chg directly  to the skin and wash gently with scrungie or a clean washcloth.  5.  Apply the CHG Soap to your body ONLY FROM THE NECK DOWN.  Do not use on open wounds or open sores.  Avoid contact with your eyes, ears, mouth and genitals (private parts).  Wash genitals (private parts) with your normal soap.  6.  Wash thoroughly, paying special attention to the area where your surgery will be performed.  7.  Thoroughly rinse your body with warm water from the neck down.  8.  DO NOT shower/wash with your normal soap after using and rinsing off the CHG Soap.  9.  Pat yourself dry with a clean towel.            10.  Wear clean pajamas.  11.  Place clean sheets on your bed the night of your first shower and do not sleep with pets.  Day of Surgery  Do not apply any lotions the morning of surgery.  Please wear clean clothes to the hospital/surgery center.:   Please read over the following fact sheets that you were given: Pain Booklet, Coughing and Deep Breathing and Surgical Site Infection Prevention

## 2013-12-27 NOTE — Progress Notes (Signed)
Spoke with Juliann Pulse from surgeons office regarding pt request for inpatient PT or SNF services; Juliann Pulse is aware.

## 2013-12-28 ENCOUNTER — Encounter (HOSPITAL_COMMUNITY): Payer: Self-pay

## 2013-12-28 NOTE — Progress Notes (Signed)
Anesthesia Chart Review:  Patient is a 63 year old female scheduled for left knee arthroscopy on 01/04/14 by Dr. Rhona Raider for loose bodies and lateral and medial meniscal tears.  History includes PDA repair '56, reported MVP and aortic sclerosis (not mentioned on 10/07/13 echo), hypercholesterolemia, hysterectomy, dental crowns, fibromyalgia, osteoarthritis, dyspnea with exertion, nephrolithiasis, post-operative N/V, positional vertigo, autonomic dysfunction with vasovagal syncope 2005 and near syncope in 2011.  On 10/08/11 while in PACU following right knee arthroscopy, she felt nauseated.  HOB was raised and patient went into asystole without a pulse.  Chest compressions started immediately (required for < 1 minute) but prior to bagging she opened eyes spontaneously and began speaking. Notes said she had two episodes, but I believe one was just bradycaria. Post CPR EKG showed brief afib with RVR/PAF.  Defibrillation pads were placed and she was transferred to [facility] for ICU admission and evaluation by cardiology. Asystole was ultimately felt likely due to increased vagal tone in the setting of post-operative N/V. She reported similar episode with medical procedures in the past or with the use of Demerol. PCP is Baruch Goldmann, PA-C.  Primary cardiologist is Dr. Acie Fredrickson. EP cardiologist is Dr. Lovena Le, last visit 05/19/12. He recommended avoid noxious stimuli but otherwise no additional medical therapy for her autonomic dysfunction.  She was seen for preoperative cardiology clearance by Richardson Dopp, PA-C on 12/23/13. She was felt to be acceptable risk, but "Given her prior history, caution should be used with anesthetics and paralytics as well as pain control."  Echo on 10/07/13 showed: Normal LV size with mild LV hypertrophy. EF 60-65%. Normal RV size and systolic function. No significant valvular abnormalities (trivial MR).  EKG on 12/23/13 showed: NSR, incomplete right BBB.  CXR on 12/27/13 showed: No active  cardiopulmonary disease.  Preoperative labs noted.    Anticipate that she can proceed as planned.  Will need to keep well hydrated, avoid hypotension, and be proactive with anti-emetic therapy.  Definitive anesthesia plan to be discussed on the day of surgery by has assigned anesthesiologist.  Myra Gianotti, PA-C [facility]/Anesthesiology Phone [phone] 12/28/2013 12:19 PM

## 2013-12-28 NOTE — Anesthesia Preprocedure Evaluation (Addendum)
Anesthesia Evaluation  Patient identified by MRN, date of birth, ID band Patient awake    Reviewed: Allergy & Precautions, H&P , NPO status , Patient's Chart, lab work & pertinent test results  History of Anesthesia Complications (+) PONV and history of anesthetic complications (brief post-op asystole felt r/t increased vagal tone with N/V 01/2012)  Airway Mallampati: III TM Distance: >3 FB Neck ROM: Full    Dental  (+) Teeth Intact   Pulmonary neg sleep apnea, neg COPDneg recent URI, former smoker,  breath sounds clear to auscultation        Cardiovascular - angina- CABG and - CHF - dysrhythmias Rhythm:Regular  MVP   Neuro/Psych  Neuromuscular disease negative psych ROS   GI/Hepatic negative GI ROS, Neg liver ROS,   Endo/Other  negative endocrine ROS  Renal/GU negative Renal ROS     Musculoskeletal  (+) Fibromyalgia -  Abdominal   Peds  Hematology negative hematology ROS (+)   Anesthesia Other Findings   Reproductive/Obstetrics                         Anesthesia Physical Anesthesia Plan  ASA: II  Anesthesia Plan: General   Post-op Pain Management:    Induction: Intravenous  Airway Management Planned: LMA  Additional Equipment: None  Intra-op Plan:   Post-operative Plan: Extubation in OR  Informed Consent: I have reviewed the patients History and Physical, chart, labs and discussed the procedure including the risks, benefits and alternatives for the proposed anesthesia with the patient or authorized representative who has indicated his/her understanding and acceptance.   Dental advisory given  Plan Discussed with: CRNA and Surgeon  Anesthesia Plan Comments:         Anesthesia Quick Evaluation

## 2013-12-31 NOTE — H&P (Signed)
Allison Hunt is an 63 y.o. female.   Chief Complaint: left knee pain HPI:: Allison Hunt is here for the first time in about a year and a half.  She has a new issue involving the left knee.  On the right side we performed an arthroscopy in 2013 and subsequently treated her with some Supartz.  She does have some degenerative change and says that knee still bothers her somewhat but it has calmed down a bit.  Unfortunately this left side has been very painful since she fell in the [place] airport on 12[place]07[place]12.  She has been treated by Baruch Goldmann PA-C quite appropriately with some pills and braces and an MRI scan.  She's here today to discuss her scan.  Her pain is intermittent and moderate.  This does wake her from sleep.  It's making her job very difficult to perform.    MRI:  I reviewed an MRI scan films and report of a study done at [facility] in [place] on 12/02/13.  This shows medial and lateral meniscal tears along with 2 significant loose bodies.  She also has some degenerative change.  Past Medical History  Diagnosis Date  . Insomnia   . Positional vertigo   . Arthritis     osteoarthritis both knees  . Cervical cancer 1984  . Medial meniscus tear 09/2011    a. right knee  . Lateral meniscal tear 09/2011    a. right knee  . 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 right knee - s/p Knee surgery 09/2011  . Dental crowns present   . Syncope, vasovagal 2005    a. exacerbated by pain/surgical procedures  . Cardiac asystole     a.  Felt to be 2/2 high vagal tone in setting of knee surgery;  b. 09/2011 Echo: EF 60-65%.  . Kidney stones 2005  . Paroxysmal atrial fibrillation     a. 09/2011 in setting of post-op knee  . Liver hemangioma   . Gallbladder polyp   . Aortic sclerosis   . Mitral valve prolapse   . Hypercholesterolemia   . Shortness of breath     with exertion  . Fibromyalgia   . Complication of anesthesia     a. Vasovagal syncope with bradycardia and asystole requiring  brief chest compression 02/07/12 & hypotension  . PONV (postoperative nausea and vomiting)     Past Surgical History  Procedure Laterality Date  . Total abdominal hysterectomy  1991    due to cervical cancer  . Patent ductus arterious repair  1956  . Knee arthroscopy  10/08/2011    Procedure: ARTHROSCOPY KNEE;  Surgeon: [name], MD;  Location: [facility];  Service: Orthopedics;  Laterality: Right;  right knee arthroscopy partial medial and lateral menisectomy and chondroplasty  . Kidney stone surgery      Family History  Problem Relation Age of Onset  . Anemia Father   . Heart attack Father   . Pulmonary embolism Father   . Alzheimer's disease Mother   . Lupus Sister   . Aortic stenosis Son     with AVR at 36 y/o  . Colon cancer Father   . Suicidality Paternal Grandfather   . Dementia Paternal Grandmother   . Colon cancer Maternal Grandmother    Social History:  reports that she has quit smoking. She has never used smokeless tobacco. She reports that she drinks alcohol. She reports that she does not use illicit drugs.  Allergies:  Allergies  Allergen Reactions  . Propofol     Pt "flat lined" twice when she had propofol administered to her during another surgery  . Demerol Nausea And Vomiting  . Tramadol Nausea And Vomiting    Pt must take with food to tolerate medication    No prescriptions prior to admission    No results found for this or any previous visit (from the past 48 hour(s)). No results found.  Review of Systems  Musculoskeletal: Positive for joint pain.       Left knee  All other systems reviewed and are negative.   There were no vitals taken for this visit. Physical Exam  Constitutional: She is oriented to person, place, and time. She appears well-developed and well-nourished.  HENT:  Head: Normocephalic and atraumatic.  Eyes: Conjunctivae and EOM are normal. Pupils are equal, round, and reactive to light.  Neck: Normal  range of motion.  Cardiovascular: Normal rate and regular rhythm.   Respiratory: Effort normal.  GI: Soft.  Musculoskeletal:  Left knee has 1+ effusion.  There are no scars.  Her motion is 0-135.  She has medial joint line pain and some crepitation.  Ligament exam is stable.   Hip motion is full and pain free and SLR is negative on both sides.  There is no palpable LAD behind either knee.  Sensation and motor function are intact on both sides and there are palpable pulses on both sides.    Neurological: She is alert and oriented to person, place, and time.  Skin: Skin is warm and dry.  Psychiatric: She has a normal mood and affect. Her behavior is normal. Judgment and thought content normal.     Assessment/Plan Assessment: Left knee torn menisci and loose bodies by MRI   Plan: Allison Hunt would be best served with an arthroscopy of her left knee.  At the time of her opposite knee arthroscopy in 2013 she had some cardiac complications afterwards.  This required a stay in the intensive care unit.  The cardiologist feeling at that point was that she had experienced a severe vagal reaction.  Nevertheless I think that if she has this arthroscopy she should have it done in the main operating room at [facility] where she would have good access to the cardiologists.  I reviewed risk of anesthesia, infection and obviously death related to the operation and anesthetic involved.  She lives alone and lives in a home with steps inside and outside.  I do believe a stay in rehabilitation would be best if possible.  I don't think I have ever ordered this for someone having an arthroscopy but in her case I think it would be the best thing for her in terms of management and avoiding complications.  She does need to see a cardiologist beforehand for some advice as to how she might be best managed.  Allison Hunt, Larwance Sachs 12/31/2013, 3:41 PM

## 2014-01-03 MED ORDER — CEFAZOLIN SODIUM-DEXTROSE 2-3 GM-% IV SOLR
2.0000 g | INTRAVENOUS | Status: AC
  Administered 2014-01-04: 2 g via INTRAVENOUS
  Filled 2014-01-03: qty 50

## 2014-01-03 MED ORDER — CHLORHEXIDINE GLUCONATE 4 % EX LIQD
60.0000 mL | Freq: Once | CUTANEOUS | Status: DC
  Filled 2014-01-03: qty 60

## 2014-01-04 ENCOUNTER — Observation Stay (HOSPITAL_COMMUNITY)
Admission: RE | Admit: 2014-01-04 | Discharge: 2014-01-05 | Disposition: A | Discharge status: Home Health | Payer: BC Managed Care – PPO | Source: Ambulatory Visit | Attending: Orthopaedic Surgery | Admitting: Orthopaedic Surgery

## 2014-01-04 ENCOUNTER — Encounter (HOSPITAL_COMMUNITY)
Admission: RE | Disposition: A | Discharge status: Home Health | Payer: Self-pay | Source: Ambulatory Visit | Attending: Orthopaedic Surgery

## 2014-01-04 ENCOUNTER — Ambulatory Visit (HOSPITAL_COMMUNITY): Payer: BC Managed Care – PPO | Admitting: Anesthesiology

## 2014-01-04 ENCOUNTER — Encounter (HOSPITAL_COMMUNITY): Payer: BC Managed Care – PPO | Admitting: Vascular Surgery

## 2014-01-04 ENCOUNTER — Encounter (HOSPITAL_COMMUNITY): Payer: Self-pay | Admitting: Anesthesiology

## 2014-01-04 DIAGNOSIS — E7800 Pure hypercholesterolemia, unspecified: Secondary | ICD-10-CM | POA: Insufficient documentation

## 2014-01-04 DIAGNOSIS — I509 Heart failure, unspecified: Secondary | ICD-10-CM | POA: Insufficient documentation

## 2014-01-04 DIAGNOSIS — Z87891 Personal history of nicotine dependence: Secondary | ICD-10-CM | POA: Diagnosis not present

## 2014-01-04 DIAGNOSIS — Z951 Presence of aortocoronary bypass graft: Secondary | ICD-10-CM | POA: Diagnosis not present

## 2014-01-04 DIAGNOSIS — M2340 Loose body in knee, unspecified knee: Secondary | ICD-10-CM | POA: Insufficient documentation

## 2014-01-04 DIAGNOSIS — M23329 Other meniscus derangements, posterior horn of medial meniscus, unspecified knee: Secondary | ICD-10-CM | POA: Insufficient documentation

## 2014-01-04 DIAGNOSIS — IMO0001 Reserved for inherently not codable concepts without codable children: Secondary | ICD-10-CM | POA: Insufficient documentation

## 2014-01-04 DIAGNOSIS — S83207A Unspecified tear of unspecified meniscus, current injury, left knee, initial encounter: Secondary | ICD-10-CM

## 2014-01-04 DIAGNOSIS — M23302 Other meniscus derangements, unspecified lateral meniscus, unspecified knee: Secondary | ICD-10-CM | POA: Diagnosis present

## 2014-01-04 DIAGNOSIS — I059 Rheumatic mitral valve disease, unspecified: Secondary | ICD-10-CM | POA: Diagnosis not present

## 2014-01-04 DIAGNOSIS — S83209A Unspecified tear of unspecified meniscus, current injury, unspecified knee, initial encounter: Secondary | ICD-10-CM

## 2014-01-04 DIAGNOSIS — M1710 Unilateral primary osteoarthritis, unspecified knee: Secondary | ICD-10-CM | POA: Insufficient documentation

## 2014-01-04 HISTORY — PX: KNEE ARTHROSCOPY: SHX127

## 2014-01-04 SURGERY — ARTHROSCOPY, KNEE
Anesthesia: General | Site: Knee | Laterality: Left

## 2014-01-04 MED ORDER — ZOLPIDEM TARTRATE 5 MG PO TABS: 5.0000 mg | ORAL_TABLET | Freq: Every evening | ORAL | Status: DC | PRN

## 2014-01-04 MED ORDER — TRAMADOL HCL 50 MG PO TABS
50.0000 mg | ORAL_TABLET | Freq: Two times a day (BID) | ORAL | Status: DC | PRN
  Administered 2014-01-05 (×2): 50 mg via ORAL
  Filled 2014-01-04 (×2): qty 1

## 2014-01-04 MED ORDER — METHYLPREDNISOLONE ACETATE 80 MG/ML IJ SUSP
INTRAMUSCULAR | Status: AC
  Filled 2014-01-04: qty 1

## 2014-01-04 MED ORDER — METHYLPREDNISOLONE ACETATE 80 MG/ML IJ SUSP
INTRAMUSCULAR | Status: DC | PRN
  Administered 2014-01-04: 80 mg

## 2014-01-04 MED ORDER — GLYCOPYRROLATE 0.2 MG/ML IJ SOLN
INTRAMUSCULAR | Status: DC | PRN
  Administered 2014-01-04: 0.2 mg via INTRAVENOUS

## 2014-01-04 MED ORDER — MIDAZOLAM HCL 2 MG/2ML IJ SOLN
INTRAMUSCULAR | Status: AC
  Filled 2014-01-04: qty 2

## 2014-01-04 MED ORDER — ACETAMINOPHEN 160 MG/5ML PO SOLN
325.0000 mg | ORAL | Status: DC | PRN
  Filled 2014-01-04: qty 20.3

## 2014-01-04 MED ORDER — SODIUM CHLORIDE 0.9 % IR SOLN
Status: DC | PRN
  Administered 2014-01-04: 6000 mL

## 2014-01-04 MED ORDER — SCOPOLAMINE 1 MG/3DAYS TD PT72
MEDICATED_PATCH | TRANSDERMAL | Status: AC
  Filled 2014-01-04: qty 1

## 2014-01-04 MED ORDER — LACTATED RINGERS IV SOLN
INTRAVENOUS | Status: DC
  Administered 2014-01-04: 14:00:00 via INTRAVENOUS

## 2014-01-04 MED ORDER — DEXTROSE 5 % IV SOLN
500.0000 mg | Freq: Four times a day (QID) | INTRAVENOUS | Status: DC | PRN
  Filled 2014-01-04: qty 5

## 2014-01-04 MED ORDER — ONDANSETRON HCL 4 MG PO TABS: 4.0000 mg | ORAL_TABLET | Freq: Four times a day (QID) | ORAL | Status: DC | PRN

## 2014-01-04 MED ORDER — MIDAZOLAM HCL 5 MG/5ML IJ SOLN
INTRAMUSCULAR | Status: DC | PRN
  Administered 2014-01-04: 2 mg via INTRAVENOUS

## 2014-01-04 MED ORDER — BUPIVACAINE-EPINEPHRINE (PF) 0.5% -1:200000 IJ SOLN
INTRAMUSCULAR | Status: AC
Start: 2014-01-04 — End: 2014-01-04
  Filled 2014-01-04: qty 30

## 2014-01-04 MED ORDER — ATORVASTATIN CALCIUM 10 MG PO TABS
10.0000 mg | ORAL_TABLET | Freq: Every day | ORAL | Status: DC
  Administered 2014-01-04: 10 mg via ORAL
  Filled 2014-01-04 (×3): qty 1

## 2014-01-04 MED ORDER — PROPOFOL 10 MG/ML IV BOLUS
INTRAVENOUS | Status: DC | PRN
  Administered 2014-01-04: 100 mg via INTRAVENOUS

## 2014-01-04 MED ORDER — HYDROMORPHONE HCL PF 1 MG/ML IJ SOLN
0.2500 mg | INTRAMUSCULAR | Status: DC | PRN
  Administered 2014-01-04: 0.5 mg via INTRAVENOUS

## 2014-01-04 MED ORDER — ACETAMINOPHEN 325 MG PO TABS: 325.0000 mg | ORAL_TABLET | ORAL | Status: DC | PRN

## 2014-01-04 MED ORDER — DEXAMETHASONE SODIUM PHOSPHATE 4 MG/ML IJ SOLN
INTRAMUSCULAR | Status: DC | PRN
  Administered 2014-01-04: 4 mg via INTRAVENOUS

## 2014-01-04 MED ORDER — BISACODYL 5 MG PO TBEC: 5.0000 mg | DELAYED_RELEASE_TABLET | Freq: Every day | ORAL | Status: DC | PRN

## 2014-01-04 MED ORDER — ONDANSETRON HCL 4 MG/2ML IJ SOLN
INTRAMUSCULAR | Status: AC
  Filled 2014-01-04: qty 2

## 2014-01-04 MED ORDER — LIDOCAINE HCL (CARDIAC) 20 MG/ML IV SOLN
INTRAVENOUS | Status: AC
Start: 2014-01-04 — End: 2014-01-04
  Filled 2014-01-04: qty 5

## 2014-01-04 MED ORDER — SODIUM FLUORIDE 0.2 % MT SOLN: 15.0000 mL | Freq: Every day | OROMUCOSAL | Status: DC

## 2014-01-04 MED ORDER — METOCLOPRAMIDE HCL 5 MG/ML IJ SOLN: 5.0000 mg | Freq: Three times a day (TID) | INTRAMUSCULAR | Status: DC | PRN

## 2014-01-04 MED ORDER — HYDROMORPHONE HCL PF 1 MG/ML IJ SOLN
INTRAMUSCULAR | Status: AC
  Filled 2014-01-04: qty 1

## 2014-01-04 MED ORDER — METOCLOPRAMIDE HCL 10 MG PO TABS: 5.0000 mg | ORAL_TABLET | Freq: Three times a day (TID) | ORAL | Status: DC | PRN

## 2014-01-04 MED ORDER — KETOROLAC TROMETHAMINE 30 MG/ML IJ SOLN: 15.0000 mg | Freq: Once | INTRAMUSCULAR | Status: DC | PRN

## 2014-01-04 MED ORDER — BUPIVACAINE-EPINEPHRINE 0.5% -1:200000 IJ SOLN
INTRAMUSCULAR | Status: DC | PRN
  Administered 2014-01-04: 10 mL

## 2014-01-04 MED ORDER — METHOCARBAMOL 500 MG PO TABS
500.0000 mg | ORAL_TABLET | Freq: Four times a day (QID) | ORAL | Status: DC | PRN
  Filled 2014-01-04: qty 1

## 2014-01-04 MED ORDER — LIDOCAINE HCL (CARDIAC) 10 MG/ML IV SOLN
INTRAVENOUS | Status: DC | PRN
  Administered 2014-01-04: 80 mg via INTRAVENOUS

## 2014-01-04 MED ORDER — CALCIUM CARBONATE-VITAMIN D 500-200 MG-UNIT PO TABS
1.0000 | ORAL_TABLET | Freq: Every day | ORAL | Status: DC
  Administered 2014-01-05: 1 via ORAL
  Filled 2014-01-04 (×2): qty 1

## 2014-01-04 MED ORDER — HYDROMORPHONE HCL PF 1 MG/ML IJ SOLN: 0.5000 mg | INTRAMUSCULAR | Status: DC | PRN

## 2014-01-04 MED ORDER — KETOROLAC TROMETHAMINE 15 MG/ML IJ SOLN
INTRAMUSCULAR | Status: AC
  Filled 2014-01-04: qty 1

## 2014-01-04 MED ORDER — FENTANYL CITRATE 0.05 MG/ML IJ SOLN
INTRAMUSCULAR | Status: DC | PRN
  Administered 2014-01-04 (×3): 50 ug via INTRAVENOUS

## 2014-01-04 MED ORDER — FENTANYL CITRATE 0.05 MG/ML IJ SOLN
INTRAMUSCULAR | Status: AC
  Filled 2014-01-04: qty 5

## 2014-01-04 MED ORDER — PROPOFOL 10 MG/ML IV BOLUS
INTRAVENOUS | Status: AC
  Filled 2014-01-04: qty 20

## 2014-01-04 MED ORDER — DIPHENHYDRAMINE HCL 12.5 MG/5ML PO ELIX: 12.5000 mg | ORAL_SOLUTION | ORAL | Status: DC | PRN

## 2014-01-04 MED ORDER — LACTATED RINGERS IV SOLN
INTRAVENOUS | Status: DC
  Administered 2014-01-04: 19:00:00 via INTRAVENOUS

## 2014-01-04 MED ORDER — ONDANSETRON HCL 4 MG/2ML IJ SOLN
4.0000 mg | Freq: Once | INTRAMUSCULAR | Status: AC | PRN
  Administered 2014-01-04: 4 mg via INTRAVENOUS

## 2014-01-04 MED ORDER — HYDROCODONE-ACETAMINOPHEN 5-325 MG PO TABS
1.0000 | ORAL_TABLET | Freq: Four times a day (QID) | ORAL | Status: DC | PRN
  Administered 2014-01-04: 1 via ORAL
  Filled 2014-01-04: qty 1

## 2014-01-04 MED ORDER — ONDANSETRON HCL 4 MG/2ML IJ SOLN: 4.0000 mg | Freq: Four times a day (QID) | INTRAMUSCULAR | Status: DC | PRN

## 2014-01-04 MED ORDER — DOCUSATE SODIUM 100 MG PO CAPS
100.0000 mg | ORAL_CAPSULE | Freq: Two times a day (BID) | ORAL | Status: DC
  Filled 2014-01-04 (×3): qty 1

## 2014-01-04 MED ORDER — ROCURONIUM BROMIDE 50 MG/5ML IV SOLN
INTRAVENOUS | Status: AC
  Filled 2014-01-04: qty 1

## 2014-01-04 MED ORDER — LACTATED RINGERS IV SOLN
INTRAVENOUS | Status: DC
  Administered 2014-01-04: 13:00:00 via INTRAVENOUS

## 2014-01-04 MED ORDER — ONDANSETRON HCL 4 MG/2ML IJ SOLN
INTRAMUSCULAR | Status: DC | PRN
  Administered 2014-01-04: 4 mg via INTRAVENOUS

## 2014-01-04 SURGICAL SUPPLY — 36 items
BANDAGE ELASTIC 4 VELCRO ST LF (GAUZE/BANDAGES/DRESSINGS) ×2 IMPLANT
BANDAGE ELASTIC 6 VELCRO ST LF (GAUZE/BANDAGES/DRESSINGS) IMPLANT
BANDAGE GAUZE ELAST BULKY 4 IN (GAUZE/BANDAGES/DRESSINGS) IMPLANT
BLADE GREAT WHITE 4.2 (BLADE) ×2 IMPLANT
BLADE SURG 11 STRL SS (BLADE) IMPLANT
BNDG GAUZE ELAST 4 BULKY (GAUZE/BANDAGES/DRESSINGS) ×2 IMPLANT
COVER SURGICAL LIGHT HANDLE (MISCELLANEOUS) ×2 IMPLANT
CUFF TOURNIQUET SINGLE 34IN LL (TOURNIQUET CUFF) IMPLANT
CUFF TOURNIQUET SINGLE 44IN (TOURNIQUET CUFF) IMPLANT
DRAPE ARTHROSCOPY W/POUCH 114 (DRAPES) ×2 IMPLANT
DRAPE U-SHAPE 47X51 STRL (DRAPES) ×2 IMPLANT
DRSG EMULSION OIL 3X3 NADH (GAUZE/BANDAGES/DRESSINGS) ×2 IMPLANT
DRSG PAD ABDOMINAL 8X10 ST (GAUZE/BANDAGES/DRESSINGS) ×2 IMPLANT
DURAPREP 26ML APPLICATOR (WOUND CARE) ×2 IMPLANT
GLOVE BIO SURGEON STRL SZ8 (GLOVE) ×8 IMPLANT
GLOVE BIOGEL PI IND STRL 8 (GLOVE) ×1 IMPLANT
GLOVE BIOGEL PI INDICATOR 8 (GLOVE) ×1
GOWN STRL REUS W/ TWL LRG LVL3 (GOWN DISPOSABLE) ×3 IMPLANT
GOWN STRL REUS W/ TWL XL LVL3 (GOWN DISPOSABLE) ×3 IMPLANT
GOWN STRL REUS W/TWL 2XL LVL3 (GOWN DISPOSABLE) ×2 IMPLANT
GOWN STRL REUS W/TWL LRG LVL3 (GOWN DISPOSABLE) ×6
GOWN STRL REUS W/TWL XL LVL3 (GOWN DISPOSABLE) ×6
KIT ROOM TURNOVER OR (KITS) ×2 IMPLANT
MANIFOLD NEPTUNE II (INSTRUMENTS) ×2 IMPLANT
NEEDLE 18GX1X1/2 (RX/OR ONLY) (NEEDLE) ×2 IMPLANT
NEEDLE 22X1 1/2 (OR ONLY) (NEEDLE) IMPLANT
NEEDLE SPNL 18GX3.5 QUINCKE PK (NEEDLE) IMPLANT
PACK ARTHROSCOPY DSU (CUSTOM PROCEDURE TRAY) ×2 IMPLANT
PAD ARMBOARD 7.5X6 YLW CONV (MISCELLANEOUS) ×4 IMPLANT
SET ARTHROSCOPY TUBING (MISCELLANEOUS) ×2
SET ARTHROSCOPY TUBING LN (MISCELLANEOUS) ×1 IMPLANT
SPONGE GAUZE 4X4 12PLY (GAUZE/BANDAGES/DRESSINGS) ×2 IMPLANT
SPONGE GAUZE 4X4 12PLY STER LF (GAUZE/BANDAGES/DRESSINGS) ×2 IMPLANT
SYR CONTROL 10ML LL (SYRINGE) ×2 IMPLANT
TOWEL OR 17X24 6PK STRL BLUE (TOWEL DISPOSABLE) ×4 IMPLANT
WATER STERILE IRR 1000ML POUR (IV SOLUTION) ×2 IMPLANT

## 2014-01-04 NOTE — Anesthesia Postprocedure Evaluation (Signed)
  Anesthesia Post-op Note  Patient: Allison Hunt  Procedure(s) Performed: Procedure(s) with comments: ARTHROSCOPY KNEE (Left) - Patient flatlined at outpatient surgery center when she had same surgery a year ago ended up in ICU for two days has had cardiac issues since 2013.  Patient Location: PACU  Anesthesia Type:General  Level of Consciousness: awake, alert  and oriented  Airway and Oxygen Therapy: Patient Spontanous Breathing  Post-op Pain: mild  Post-op Assessment: Post-op Vital signs reviewed, Patient's Cardiovascular Status Stable, Respiratory Function Stable, Patent Airway, NAUSEA AND VOMITING PRESENT and Pain level controlled  Post-op Vital Signs: Reviewed and stable  Last Vitals:  Filed Vitals:   01/04/14 1628  BP: 143/69  Pulse: 92  Temp: 36.6 C  Resp: 16    Complications: No apparent anesthesia complications

## 2014-01-04 NOTE — Op Note (Signed)
#  [id number] 

## 2014-01-04 NOTE — Interval H&P Note (Signed)
History and Physical Interval Note:  01/04/2014 12:33 PM  Allison Hunt  has presented today for surgery, with the diagnosis of LEFT KNEE LOOSE BODIES, [place]  The various methods of treatment have been discussed with the patient and family. After consideration of risks, benefits and other options for treatment, the patient has consented to  Procedure(s) with comments: ARTHROSCOPY KNEE (Left) - Patient flatlined at outpatient surgery center when she had same surgery a year ago ended up in ICU for two days has had cardiac issues since 2013. as a surgical intervention .  The patient's history has been reviewed, patient examined, no change in status, stable for surgery.  I have reviewed the patient's chart and labs.  Questions were answered to the patient's satisfaction.     [name] G

## 2014-01-04 NOTE — Transfer of Care (Signed)
Immediate Anesthesia Transfer of Care Note  Patient: Allison Hunt  Procedure(s) Performed: Procedure(s) with comments: ARTHROSCOPY KNEE (Left) - Patient flatlined at outpatient surgery center when she had same surgery a year ago ended up in ICU for two days has had cardiac issues since 2013.  Patient Location: PACU  Anesthesia Type:General  Level of Consciousness: awake, alert , oriented and patient cooperative  Airway & Oxygen Therapy: Patient Spontanous Breathing  Post-op Assessment: Report given to PACU RN, Post -op Vital signs reviewed and stable and Patient moving all extremities  Post vital signs: Reviewed and stable  Complications: No apparent anesthesia complications

## 2014-01-05 ENCOUNTER — Encounter (HOSPITAL_COMMUNITY): Payer: Self-pay | Admitting: Orthopaedic Surgery

## 2014-01-05 DIAGNOSIS — M23302 Other meniscus derangements, unspecified lateral meniscus, unspecified knee: Secondary | ICD-10-CM | POA: Diagnosis not present

## 2014-01-05 MED ORDER — HYDROCODONE-ACETAMINOPHEN 5-325 MG PO TABS: 1.0000 | ORAL_TABLET | Freq: Four times a day (QID) | ORAL | Status: DC | PRN

## 2014-01-05 MED ORDER — TRAMADOL HCL 50 MG PO TABS: 50.0000 mg | ORAL_TABLET | Freq: Four times a day (QID) | ORAL | Status: DC | PRN

## 2014-01-05 NOTE — Discharge Summary (Signed)
Patient ID: Allison Hunt MRN: [id number] DOB/AGE: March 01, 1951 63 y.o.  Admit date: 01/04/2014 Discharge date: 01/05/2014  Admission Diagnoses:  Principal Problem:   Meniscus tear   Discharge Diagnoses:  Same  Past Medical History  Diagnosis Date  . Insomnia   . Positional vertigo   . Arthritis     osteoarthritis both knees  . Cervical cancer 1984  . Medial meniscus tear 09/2011    a. right knee  . Lateral meniscal tear 09/2011    a. right knee  . 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 right knee - s/p Knee surgery 09/2011  . Dental crowns present   . Syncope, vasovagal 2005    a. exacerbated by pain/surgical procedures  . Cardiac asystole     a.  Felt to be 2/2 high vagal tone in setting of knee surgery;  b. 09/2011 Echo: EF 60-65%.  . Kidney stones 2005  . Paroxysmal atrial fibrillation     a. 09/2011 in setting of post-op knee  . Liver hemangioma   . Gallbladder polyp   . Aortic sclerosis   . Mitral valve prolapse   . Hypercholesterolemia   . Shortness of breath     with exertion  . Fibromyalgia   . Complication of anesthesia     a. Vasovagal syncope with bradycardia and asystole requiring brief chest compression 02/07/12 & hypotension  . PONV (postoperative nausea and vomiting)     Surgeries: Procedure(s): ARTHROSCOPY KNEE on 01/04/2014   Consultants:    Discharged Condition: Improved  Hospital Course: Helon Wisinski is an 63 y.o. female who was admitted 01/04/2014 for operative treatment ofMeniscus tear. Patient has severe unremitting pain that affects sleep, daily activities, and work/hobbies. After pre-op clearance the patient was taken to the operating room on 01/04/2014 and underwent  Procedure(s): ARTHROSCOPY KNEE.    Patient was given perioperative antibiotics: Anti-infectives   Start     Dose/Rate Route Frequency Ordered Stop   01/04/14 0600  ceFAZolin (ANCEF) IVPB 2 g/50 mL premix     2 g 100 mL/hr over 30 Minutes Intravenous On call to O.R.  01/03/14 1416 01/04/14 1420       Patient was given sequential compression devices, early ambulation, and chemoprophylaxis to prevent DVT.  Patient benefited maximally from hospital stay and there were no complications.    Recent vital signs: Patient Vitals for the past 24 hrs:  BP Temp Temp src Pulse Resp SpO2 Height Weight  01/05/14 0510 162/67 mmHg 98.1 F (36.7 C) Oral 78 16 97 % - -  01/05/14 0015 156/74 mmHg 98.2 F (36.8 C) Oral 89 16 96 % - -  01/04/14 2035 135/59 mmHg 98.2 F (36.8 C) Oral 79 16 97 % - -  01/04/14 1641 - - - - - - 5\' 3"  (1.6 m) 68.947 kg (152 lb)  01/04/14 1628 143/69 mmHg 97.9 F (36.6 C) Oral 92 16 100 % - -  01/04/14 1615 139/68 mmHg 98 F (36.7 C) - 78 15 98 % - -  01/04/14 1545 150/72 mmHg - - 66 16 100 % - -  01/04/14 1530 122/56 mmHg - - 59 10 98 % - -  01/04/14 1515 106/70 mmHg - - 67 20 97 % - -  01/04/14 1500 130/63 mmHg - - 57 11 99 % - -  01/04/14 1445 136/67 mmHg 98 F (36.7 C) - 76 19 100 % - -  01/04/14 1147 162/72 mmHg 97.8 F (36.6 C) Oral 80 20 100 % -  68.947 kg (152 lb)     Recent laboratory studies: No results found for this basename: WBC, HGB, HCT, PLT, NA, K, CL, CO2, BUN, CREATININE, GLUCOSE, PT, INR, CALCIUM, 2,  in the last 72 hours   Discharge Medications:     Medication List    STOP taking these medications       diclofenac 75 MG EC tablet  Commonly known as:  VOLTAREN      TAKE these medications       atorvastatin 10 MG tablet  Commonly known as:  LIPITOR  Take 10 mg by mouth daily at 6 PM. Bed-time     calcium-vitamin D 500-200 MG-UNIT per tablet  Commonly known as:  OSCAL WITH D  Take 1 tablet by mouth daily with breakfast.     HYDROcodone-acetaminophen 5-325 MG per tablet  Commonly known as:  NORCO/VICODIN  Take 1 tablet by mouth every 6 (six) hours as needed for moderate pain or severe pain.     PREVIDENT 0.2 % Soln  Generic drug:  SODIUM FLUORIDE (DENTAL RINSE)  Take 15 mLs by mouth daily. Sensitive  teeth     traMADol 50 MG tablet  Commonly known as:  ULTRAM  Take 1 tablet (50 mg total) by mouth every 6 (six) hours as needed for moderate pain (*pt must take with food to avoid nauseau and vomiting*).     zaleplon 10 MG capsule  Commonly known as:  SONATA  Take 10 mg by mouth at bedtime as needed for sleep.        Diagnostic Studies: Dg Chest 2 View  12/27/2013   CLINICAL DATA:  Preop for chest surgery, mitral valve prolapse  EXAM: CHEST  2 VIEW  COMPARISON:  10/08/2011  FINDINGS: There is no focal parenchymal opacity, pleural effusion, or pneumothorax. The heart and mediastinal contours are unremarkable.  There is mild thoracic spine spondylosis.  IMPRESSION: No active cardiopulmonary disease.   Electronically Signed   By: Kathreen Devoid   On: 12/27/2013 13:52    Disposition: 01-Home or Self Care      Discharge Instructions   Call MD / Call 911    Complete by:  As directed   If you experience chest pain or shortness of breath, CALL 911 and be transported to the hospital emergency room.  If you develope a fever above 101 F, pus (white drainage) or increased drainage or redness at the wound, or calf pain, call your surgeon's office.     Constipation Prevention    Complete by:  As directed   Drink plenty of fluids.  Prune juice may be helpful.  You may use a stool softener, such as Colace (over the counter) 100 mg twice a day.  Use MiraLax (over the counter) for constipation as needed.     Diet - low sodium heart healthy    Complete by:  As directed      Increase activity slowly as tolerated    Complete by:  As directed            Follow-up Information   Follow up with [name], MD. Call in 1 week.   Specialty:  Orthopedic Surgery   Contact information:   [address] [place] [address] [phone]        Signed: Rich Fuchs 01/05/2014, 8:08 AM

## 2014-01-05 NOTE — Op Note (Signed)
NAMEARLINE, Allison Hunt           ACCOUNT NO.:  [id number]  MEDICAL RECORD NO.:  [id number]  LOCATION:  5N19C                        FACILITY:  [facility]  PHYSICIAN:  Monico Blitz. Madison Direnzo, M.D.DATE OF BIRTH:  Jan 15, 1951  DATE OF PROCEDURE:  01/04/2014 DATE OF DISCHARGE:                              OPERATIVE REPORT   PREOPERATIVE DIAGNOSES: 1. Left knee torn medial torn lateral meniscus. 2. Left knee loose bodies.  POSTOPERATIVE DIAGNOSES: 1. Left knee torn medial torn lateral meniscus. 2. Left knee loose bodies.  PROCEDURES: 1. Left knee partial medial and partial lateral meniscectomy. 2. Left knee removal of loose bodies. 3. Left knee abrasion chondroplasty patellofemoral.  ANESTHESIA:  General.  ATTENDING SURGEON:  Monico Blitz. Rhona Raider, M.D.  ASSISTANT:  Loni Dolly, PA.  INDICATION FOR PROCEDURE:  The patient is a 63 year old woman with a long history of painful locking and catching at her left knee.  This has persisted despite various injections and pills and therapies and braces. By MRI scan, she has some meniscal tears and some loose bodies.  She is offered arthroscopy.  Informed operative consent was obtained after discussion of possible complications including reaction to anesthesia, infection, and arrhythmia.  Had an arthroscopy a couple of years ago on the opposite knee.  She sustained a significant vagal response and required a stay in the hospital.  She met with the Anesthesia prior to this case.  SUMMARY OF FINDINGS AND PROCEDURE:  Under general anesthesia, an arthroscopy of the left knee was performed.  Suprapatellar pouch was benign while the patellofemoral joint exhibited grade 4 change across broad areas of patellar and intertrochlear groove.  A chondroplasty was done along with abrasion to bleeding bone and some small areas.  Medial compartment exhibited a small posterior horn medial meniscus tear, dressed with about a 5% partial medial meniscectomy.  ACL  looked intact. Lateral part was notable for a small lateral tear, dressed with a tiny partial lateral meniscectomy.  More significantly, she had two loose bodies, one of which was bony, incarcerated under the lateral meniscus. These were removed.  One was about 7 or 8 mm in diameter.  She was discharged home   Dictation ended at this point.     Monico Blitz Rhona Raider, M.D.     PGD/MEDQ  D:  01/04/2014  T:  01/05/2014  Job:  [id number]

## 2014-01-05 NOTE — Evaluation (Signed)
Physical Therapy Evaluation Patient Details Name: Allison Hunt MRN: [id number] DOB: 11-23-1950 Today's Date: 01/05/2014   History of Present Illness  pt presents with L Meniscectomy.    Clinical Impression  Pt Mod I with all mobility except stairs.  Pt adamantly refusing to perform stairs and refusing to sit up in chair.  Pt states "I already know what I need to do."  No further acute PT needs at this time.  Will sign off.      Follow Up Recommendations No PT follow up;Supervision - Intermittent    Equipment Recommendations  Rolling walker with 5" wheels    Recommendations for Other Services       Precautions / Restrictions Precautions Precautions: None Restrictions Weight Bearing Restrictions: Yes LLE Weight Bearing: Weight bearing as tolerated      Mobility  Bed Mobility Overal bed mobility: Modified Independent                Transfers Overall transfer level: Modified independent Equipment used: Rolling walker (2 wheeled)                Ambulation/Gait Ambulation/Gait assistance: Modified independent (Device/Increase time) Ambulation Distance (Feet): 100 Feet Assistive device: Rolling walker (2 wheeled) Gait Pattern/deviations: Step-to pattern;Decreased step length - right;Decreased stance time - left     General Gait Details: pt moving well without any need for A.    Stairs Stairs:  (pt adamantly declined to try stairs.  )          Wheelchair Mobility    Modified Rankin (Stroke Patients Only)       Balance Overall balance assessment: Modified Independent                                           Pertinent Vitals/Pain L knee 5/10.  Premedicated.      Home Living Family/patient expects to be discharged to:: Private residence Living Arrangements: Alone Available Help at Discharge: Friend(s);Available PRN/intermittently Type of Home: House Home Access: Stairs to enter Entrance Stairs-Rails: None Entrance  Stairs-Number of Steps: 1 Home Layout: Two level Home Equipment: Crutches      Prior Function Level of Independence: Independent               Hand Dominance        Extremity/Trunk Assessment   Upper Extremity Assessment: Overall WFL for tasks assessed           Lower Extremity Assessment: LLE deficits/detail   LLE Deficits / Details: pt guards and limits ROM.  AROM ~ 10 - 50.   Cervical / Trunk Assessment: Normal  Communication   Communication: No difficulties  Cognition Arousal/Alertness: Awake/alert Behavior During Therapy: WFL for tasks assessed/performed Overall Cognitive Status: Within Functional Limits for tasks assessed                      General Comments      Exercises        Assessment/Plan    PT Assessment Patent does not need any further PT services  PT Diagnosis     PT Problem List    PT Treatment Interventions     PT Goals (Current goals can be found in the Care Plan section) Acute Rehab PT Goals PT Goal Formulation: No goals set, d/c therapy    Frequency     Barriers to discharge  Co-evaluation               End of Session   Activity Tolerance: Patient tolerated treatment well Patient left: in bed;with call bell/phone within reach Nurse Communication: Mobility status    Functional Assessment Tool Used: Clinical Judgement Functional Limitation: Mobility: Walking and moving around Mobility: Walking and Moving Around Current Status (A2130): 0 percent impaired, limited or restricted Mobility: Walking and Moving Around Goal Status [phone]): 0 percent impaired, limited or restricted Mobility: Walking and Moving Around Discharge Status [phone]): 0 percent impaired, limited or restricted    Time: 9528-4132 PT Time Calculation (min): 22 min   Charges:   PT Evaluation $Initial PT Evaluation Tier I: 1 Procedure PT Treatments $Gait Training: 8-22 mins   PT G Codes:   Functional Assessment Tool Used:  Clinical Judgement Functional Limitation: Mobility: Walking and moving around    [place]ornton Papas, [place] [phone] 01/05/2014, 10:38 AM

## 2014-01-05 NOTE — Op Note (Signed)
NAMEDACOTA, DEVALL           ACCOUNT NO.:  [id number]  MEDICAL RECORD NO.:  [id number]  LOCATION:  5N19C                        FACILITY:  [facility]  PHYSICIAN:  Monico Blitz. Morrisa Aldaba, M.D.DATE OF BIRTH:  12/21/1950  DATE OF PROCEDURE:  01/04/2014 DATE OF DISCHARGE:                              OPERATIVE REPORT   ADDENDUM:  DESCRIPTION OF PROCEDURE:  The patient was taken operating suite where general anesthetic was applied without difficulty.  She was positioned supine and prepped and draped in normal sterile fashion.  After administration of preop IV Kefzol and an appropriate time-out, an arthroscopy of the left knee was performed through total of 2 portals. Findings were as noted above and procedure consisted of partial medial and partial lateral meniscectomies done with baskets and shavers followed by removal of loose bodies, as outlined above.  We also performed the abrasion chondroplasty patellofemoral.  The knee was thoroughly irrigated at the end of the case followed by placement of Marcaine with epinephrine and some Depo-Medrol.  Adaptic was placed over the portals, followed by dry gauze and loose Ace wrap.  Estimated blood loss and fluids obtained from anesthesia records.  DISPOSITION:  The patient was extubated in operating room and taken to recovery room in stable condition.  PLANS:  Were for her to stay overnight for observation considering the nature of her cardiac event after her arthroscopy 2 years ago on the opposite side.  Follows as well.  She will go home tomorrow.     Monico Blitz Rhona Raider, M.D.     PGD/MEDQ  D:  01/04/2014  T:  01/05/2014  Job:  [id number]

## 2014-01-05 NOTE — Progress Notes (Signed)
Utilization Review Completed.Ark Agrusa T7/29/2015  

## 2014-01-05 NOTE — Progress Notes (Signed)
Patient discharged home with friend. Prescriptions and walker given. Pain medication given before discharge.

## 2014-01-05 NOTE — Progress Notes (Signed)
Subjective: 1 Day Post-Op Procedure(s) (LRB): ARTHROSCOPY KNEE (Left)  Activity level:  wbat Diet tolerance:  Eating well Voiding:  ok Patient reports pain as mild.    Objective: Vital signs in last 24 hours: Temp:  [97.8 F (36.6 C)-98.2 F (36.8 C)] 98.1 F (36.7 C) (07/29 0510) Pulse Rate:  [57-92] 78 (07/29 0510) Resp:  [10-20] 16 (07/29 0510) BP: (106-162)/(56-74) 162/67 mmHg (07/29 0510) SpO2:  [96 %-100 %] 97 % (07/29 0510) Weight:  [68.947 kg (152 lb)] 68.947 kg (152 lb) (07/28 1641)  Labs: No results found for this basename: HGB,  in the last 72 hours No results found for this basename: WBC, RBC, HCT, PLT,  in the last 72 hours No results found for this basename: NA, K, CL, CO2, BUN, CREATININE, GLUCOSE, CALCIUM,  in the last 72 hours No results found for this basename: LABPT, INR,  in the last 72 hours  Physical Exam:  Neurologically intact ABD soft Neurovascular intact Sensation intact distally Intact pulses distally Dorsiflexion/Plantar flexion intact Incision: dressing C/D/I No cellulitis present Compartment soft  Assessment/Plan:  1 Day Post-Op Procedure(s) (LRB): ARTHROSCOPY KNEE (Left) Advance diet Up with therapy Discharge home with home health today after PT this morning. Follow up in office in 1 week.    Jannessa Ogden, Larwance Sachs 01/05/2014, 8:05 AM

## 2014-01-24 ENCOUNTER — Telehealth: Payer: Self-pay | Admitting: *Deleted

## 2014-01-24 NOTE — Telephone Encounter (Signed)
Pt walk in ,C/O of being in physical therapy for post knee surgery. Pt stales the BP was in the 998'P systolic and felt dizzy. The physical therapist told the pt to come to this office to be seen. Pt was made aware that Dr. Lovena Le and Richardson Dopp are not in the office today, and I could do is to take her BP again and see what is now. Pt states " the BP has been taking if no one can see me I just going back to [facility] orthopedic and left.  Pt has not been diagnosed with  Hypertension. She has a history of high Vagal tone and vasovagal Syncope, and Hyperlipidemia.

## 2014-05-03 ENCOUNTER — Ambulatory Visit: Payer: BC Managed Care – PPO | Admitting: Internal Medicine

## 2014-05-24 ENCOUNTER — Emergency Department (HOSPITAL_BASED_OUTPATIENT_CLINIC_OR_DEPARTMENT_OTHER): Payer: BC Managed Care – PPO

## 2014-05-24 ENCOUNTER — Encounter (HOSPITAL_BASED_OUTPATIENT_CLINIC_OR_DEPARTMENT_OTHER): Payer: Self-pay | Admitting: *Deleted

## 2014-05-24 ENCOUNTER — Emergency Department (HOSPITAL_BASED_OUTPATIENT_CLINIC_OR_DEPARTMENT_OTHER)
Admission: EM | Admit: 2014-05-24 | Discharge: 2014-05-24 | Disposition: A | Discharge status: Home / Self Care | Payer: BC Managed Care – PPO | Attending: Emergency Medicine | Admitting: Emergency Medicine

## 2014-05-24 DIAGNOSIS — Z87828 Personal history of other (healed) physical injury and trauma: Secondary | ICD-10-CM | POA: Diagnosis not present

## 2014-05-24 DIAGNOSIS — Z791 Long term (current) use of non-steroidal anti-inflammatories (NSAID): Secondary | ICD-10-CM | POA: Insufficient documentation

## 2014-05-24 DIAGNOSIS — E780 Pure hypercholesterolemia: Secondary | ICD-10-CM | POA: Insufficient documentation

## 2014-05-24 DIAGNOSIS — Z79899 Other long term (current) drug therapy: Secondary | ICD-10-CM | POA: Diagnosis not present

## 2014-05-24 DIAGNOSIS — Z862 Personal history of diseases of the blood and blood-forming organs and certain disorders involving the immune mechanism: Secondary | ICD-10-CM | POA: Insufficient documentation

## 2014-05-24 DIAGNOSIS — M179 Osteoarthritis of knee, unspecified: Secondary | ICD-10-CM | POA: Diagnosis not present

## 2014-05-24 DIAGNOSIS — Z87891 Personal history of nicotine dependence: Secondary | ICD-10-CM | POA: Insufficient documentation

## 2014-05-24 DIAGNOSIS — J40 Bronchitis, not specified as acute or chronic: Secondary | ICD-10-CM | POA: Diagnosis not present

## 2014-05-24 DIAGNOSIS — Z98811 Dental restoration status: Secondary | ICD-10-CM | POA: Diagnosis not present

## 2014-05-24 DIAGNOSIS — M797 Fibromyalgia: Secondary | ICD-10-CM | POA: Insufficient documentation

## 2014-05-24 DIAGNOSIS — R05 Cough: Secondary | ICD-10-CM | POA: Diagnosis present

## 2014-05-24 DIAGNOSIS — Z8719 Personal history of other diseases of the digestive system: Secondary | ICD-10-CM | POA: Insufficient documentation

## 2014-05-24 DIAGNOSIS — Z8679 Personal history of other diseases of the circulatory system: Secondary | ICD-10-CM | POA: Insufficient documentation

## 2014-05-24 DIAGNOSIS — Z8541 Personal history of malignant neoplasm of cervix uteri: Secondary | ICD-10-CM | POA: Insufficient documentation

## 2014-05-24 DIAGNOSIS — G4700 Insomnia, unspecified: Secondary | ICD-10-CM | POA: Insufficient documentation

## 2014-05-24 DIAGNOSIS — R059 Cough, unspecified: Secondary | ICD-10-CM

## 2014-05-24 MED ORDER — PREDNISONE 20 MG PO TABS
40.0000 mg | ORAL_TABLET | Freq: Once | ORAL | Status: AC
  Administered 2014-05-24: 40 mg via ORAL
  Filled 2014-05-24: qty 2

## 2014-05-24 MED ORDER — ALBUTEROL SULFATE HFA 108 (90 BASE) MCG/ACT IN AERS
2.0000 | INHALATION_SPRAY | Freq: Once | RESPIRATORY_TRACT | Status: AC
  Administered 2014-05-24: 2 via RESPIRATORY_TRACT
  Filled 2014-05-24: qty 6.7

## 2014-05-24 MED ORDER — PREDNISONE 20 MG PO TABS: ORAL_TABLET | ORAL | Status: DC

## 2014-05-24 MED ORDER — GUAIFENESIN-CODEINE 100-10 MG/5ML PO SOLN
5.0000 mL | Freq: Once | ORAL | Status: AC
  Administered 2014-05-24: 5 mL via ORAL
  Filled 2014-05-24: qty 5

## 2014-05-24 MED ORDER — GUAIFENESIN-CODEINE 100-10 MG/5ML PO SYRP: 5.0000 mL | ORAL_SOLUTION | Freq: Three times a day (TID) | ORAL | Status: DC | PRN

## 2014-05-24 NOTE — ED Notes (Signed)
Pt c/o pro cough x 4 days

## 2014-05-24 NOTE — Discharge Instructions (Signed)

## 2014-05-24 NOTE — ED Provider Notes (Signed)
CSN: [id number]     Arrival date & time 05/24/14  1749 History  This chart was scribed for Ernestina Patches, MD by Hilda Lias, ED Scribe. This patient was seen in room MH03/MH03 and the patient's care was started at 8:35 PM.    Chief Complaint  Patient presents with  . Cough    The history is provided by the patient. No language interpreter was used.    HPI Comments: Allison Hunt is a 63 y.o. female who presents to the Emergency Department complaining of an intermittent cough that has been present for about a week. Pt has been taking Delsym D with no relief. Pt states that she has not had a cough this bad in 40 years. Pt denies having a productive cough, and denies any trouble breathing. Pt states that she was just in the [place], but has not traveled outside of the country any time soon. Pt states that she stopped smoking 25 years ago, and denies taking any antibiotics recently. Pt denies fevers, chills, body aches, nausea, vomiting, and diarrhea.      Past Medical History  Diagnosis Date  . Insomnia   . Positional vertigo   . Arthritis     osteoarthritis both knees  . Cervical cancer 1984  . Medial meniscus tear 09/2011    a. right knee  . Lateral meniscal tear 09/2011    a. right knee  . 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 right knee - s/p Knee surgery 09/2011  . Dental crowns present   . Syncope, vasovagal 2005    a. exacerbated by pain/surgical procedures  . Cardiac asystole     a.  Felt to be 2/2 high vagal tone in setting of knee surgery;  b. 09/2011 Echo: EF 60-65%.  . Kidney stones 2005  . Paroxysmal atrial fibrillation     a. 09/2011 in setting of post-op knee  . Liver hemangioma   . Gallbladder polyp   . Aortic sclerosis   . Mitral valve prolapse   . Hypercholesterolemia   . Shortness of breath     with exertion  . Fibromyalgia   . Complication of anesthesia     a. Vasovagal syncope with bradycardia and asystole requiring brief chest compression 02/07/12 &  hypotension  . PONV (postoperative nausea and vomiting)    Past Surgical History  Procedure Laterality Date  . Total abdominal hysterectomy  1991    due to cervical cancer  . Patent ductus arterious repair  1956  . Knee arthroscopy  10/08/2011    Procedure: ARTHROSCOPY KNEE;  Surgeon: [name], MD;  Location: [facility];  Service: Orthopedics;  Laterality: Right;  right knee arthroscopy partial medial and lateral menisectomy and chondroplasty  . Kidney stone surgery    . Knee arthroscopy Left 01/04/2014    Procedure: ARTHROSCOPY KNEE;  Surgeon: [name], MD;  Location: [facility];  Service: Orthopedics;  Laterality: Left;  Patient flatlined at outpatient surgery center when she had same surgery a year ago ended up in ICU for two days has had cardiac issues since 2013.   Family History  Problem Relation Age of Onset  . Anemia Father   . Heart attack Father   . Pulmonary embolism Father   . Alzheimer's disease Mother   . Lupus Sister   . Aortic stenosis Son     with AVR at 67 y/o  . Colon cancer Father   . Suicidality Paternal Grandfather   . Dementia Paternal  Grandmother   . Colon cancer Maternal Grandmother    History  Substance Use Topics  . Smoking status: Former Research scientist (life sciences)  . Smokeless tobacco: Never Used     Comment: quit smoking cigarettes in 1991  . Alcohol Use: Yes     Comment: 4-5 x/week   OB History    No data available     Review of Systems  Constitutional: Negative for fever, chills, activity change, appetite change and fatigue.  HENT: Negative for congestion and facial swelling.   Eyes: Negative for photophobia.  Respiratory: Positive for cough. Negative for chest tightness.   Cardiovascular: Negative for palpitations and leg swelling.  Gastrointestinal: Negative for nausea, vomiting, abdominal pain and diarrhea.  Endocrine: Negative for polydipsia and polyuria.  Genitourinary: Negative for dysuria, frequency, difficulty urinating and  pelvic pain.  Musculoskeletal: Negative for back pain, arthralgias, neck pain and neck stiffness.  Skin: Negative for color change and wound.  Allergic/Immunologic: Negative for immunocompromised state.  Neurological: Negative for facial asymmetry, weakness and numbness.  Hematological: Does not bruise/bleed easily.  Psychiatric/Behavioral: Negative for confusion and agitation.      Allergies  Propofol; Demerol; and Tramadol  Home Medications   Prior to Admission medications   Medication Sig Start Date End Date Taking? Authorizing Provider  diclofenac (CATAFLAM) 50 MG tablet Take 75 mg by mouth 3 (three) times daily.   Yes Historical Provider, MD  atorvastatin (LIPITOR) 10 MG tablet Take 10 mg by mouth daily at 6 PM. Bed-time 12/03/13   Historical Provider, MD  calcium-vitamin D (OSCAL WITH D) 500-200 MG-UNIT per tablet Take 1 tablet by mouth daily with breakfast.    Historical Provider, MD  guaiFENesin-codeine (ROBITUSSIN AC) 100-10 MG/5ML syrup Take 5 mLs by mouth 3 (three) times daily as needed for cough. 05/24/14   Ernestina Patches, MD  HYDROcodone-acetaminophen (NORCO/VICODIN) 5-325 MG per tablet Take 1 tablet by mouth every 6 (six) hours as needed for moderate pain or severe pain. 01/05/14   Rich Fuchs, PA-C  predniSONE (DELTASONE) 20 MG tablet 3 tabs po day one, then 2 tabs daily x 4 days 05/24/14   Ernestina Patches, MD  PREVIDENT 0.2 % SOLN Take 15 mLs by mouth daily. Sensitive teeth 11/17/13   Historical Provider, MD  traMADol (ULTRAM) 50 MG tablet Take 1 tablet (50 mg total) by mouth every 6 (six) hours as needed for moderate pain (*pt must take with food to avoid nauseau and vomiting*). 01/05/14   Larwance Sachs Nida, PA-C  zaleplon (SONATA) 10 MG capsule Take 10 mg by mouth at bedtime as needed for sleep.     Historical Provider, MD   BP 147/81 mmHg  Pulse 73  Temp(Src) 98.2 F (36.8 C) (Oral)  Resp 16  Ht 5\' 3"  (1.6 m)  Wt 152 lb (68.947 kg)  BMI 26.93 kg/m2  SpO2  96% Physical Exam  Constitutional: She is oriented to person, place, and time. She appears well-developed and well-nourished. No distress.  HENT:  Head: Normocephalic.  Mouth/Throat: Oropharynx is clear and moist.  Eyes: Pupils are equal, round, and reactive to light.  Neck: Neck supple.  Cardiovascular: Normal rate, regular rhythm and normal heart sounds.   Pulmonary/Chest: Effort normal and breath sounds normal. No respiratory distress. She has no wheezes.  Abdominal: Soft. She exhibits no distension. There is no tenderness. There is no rebound and no guarding.  Musculoskeletal: She exhibits no edema or tenderness.  Neurological: She is alert and oriented to person, place, and time.  Skin: Skin  is warm and dry.  Psychiatric: She has a normal mood and affect.  Nursing note and vitals reviewed.   ED Course  Procedures (including critical care time)  DIAGNOSTIC STUDIES: Oxygen Saturation is 97% on RA, normal by my interpretation.    COORDINATION OF CARE: 8:43 PM Discussed treatment plan with pt at bedside and pt agreed to plan.   Labs Review Labs Reviewed - No data to display  Imaging Review Dg Chest 2 View  05/24/2014   CLINICAL DATA:  Productive cough for 1 week, worsening over the past 3 days. Shortness of breath.  EXAM: CHEST  2 VIEW  COMPARISON:  12/27/2013  FINDINGS: Mild dextroconvex thoracic scoliosis. Cardiac and mediastinal margins appear normal. Azygos fissure noted.  Thoracic spondylosis.  No pleural effusion.  IMPRESSION: 1. No pneumonia or acute findings. 2. Stable thoracic scoliosis.   Electronically Signed   By: Sherryl Barters M.D.   On: 05/24/2014 18:35     EKG Interpretation None      MDM   Final diagnoses:  Bronchitis    Pt is a 63 y.o. female with Pmhx as above who presents with 4 days of productive cough. Lungs clear, no fever, well appearing on PE. CXR nml. Symptoms c/w viral bronchitis and abx will not be helpful. Will rec continued outpt  supportive care. Short course of prednisone given as well as guaifenesin with codeine.       Wayland Denis evaluation in the Emergency Department is complete. It has been determined that no acute conditions requiring further emergency intervention are present at this time. The patient/guardian have been advised of the diagnosis and plan. We have discussed signs and symptoms that warrant return to the ED, such as changes or worsening in symptoms, fever, SOB, inability to tolerate liquids.    I personally performed the services described in this documentation, which was scribed in my presence. The recorded information has been reviewed and is accurate.      Ernestina Patches, MD 05/26/14 [phone]

## 2015-10-04 DIAGNOSIS — Z1231 Encounter for screening mammogram for malignant neoplasm of breast: Secondary | ICD-10-CM | POA: Diagnosis not present

## 2015-12-11 DIAGNOSIS — M1711 Unilateral primary osteoarthritis, right knee: Secondary | ICD-10-CM | POA: Diagnosis not present

## 2016-02-13 DIAGNOSIS — E785 Hyperlipidemia, unspecified: Secondary | ICD-10-CM | POA: Diagnosis not present

## 2016-02-13 DIAGNOSIS — Z0000 Encounter for general adult medical examination without abnormal findings: Secondary | ICD-10-CM | POA: Diagnosis not present

## 2016-02-21 DIAGNOSIS — E782 Mixed hyperlipidemia: Secondary | ICD-10-CM | POA: Diagnosis not present

## 2016-02-21 DIAGNOSIS — Z87442 Personal history of urinary calculi: Secondary | ICD-10-CM | POA: Diagnosis not present

## 2016-02-21 DIAGNOSIS — K7689 Other specified diseases of liver: Secondary | ICD-10-CM | POA: Diagnosis not present

## 2016-02-21 DIAGNOSIS — F5101 Primary insomnia: Secondary | ICD-10-CM | POA: Diagnosis not present

## 2016-02-21 DIAGNOSIS — M170 Bilateral primary osteoarthritis of knee: Secondary | ICD-10-CM | POA: Diagnosis not present

## 2016-02-21 DIAGNOSIS — Z8679 Personal history of other diseases of the circulatory system: Secondary | ICD-10-CM | POA: Diagnosis not present

## 2016-02-21 DIAGNOSIS — K824 Cholesterolosis of gallbladder: Secondary | ICD-10-CM | POA: Diagnosis not present

## 2016-02-21 DIAGNOSIS — Z0000 Encounter for general adult medical examination without abnormal findings: Secondary | ICD-10-CM | POA: Diagnosis not present

## 2016-02-21 DIAGNOSIS — N6009 Solitary cyst of unspecified breast: Secondary | ICD-10-CM | POA: Diagnosis not present

## 2016-02-21 DIAGNOSIS — R928 Other abnormal and inconclusive findings on diagnostic imaging of breast: Secondary | ICD-10-CM | POA: Diagnosis not present

## 2016-02-21 DIAGNOSIS — M8580 Other specified disorders of bone density and structure, unspecified site: Secondary | ICD-10-CM | POA: Diagnosis not present

## 2016-02-21 DIAGNOSIS — I10 Essential (primary) hypertension: Secondary | ICD-10-CM | POA: Diagnosis not present

## 2016-04-04 DIAGNOSIS — H6123 Impacted cerumen, bilateral: Secondary | ICD-10-CM | POA: Diagnosis not present

## 2016-05-09 ENCOUNTER — Encounter: Payer: Self-pay | Admitting: Gastroenterology

## 2016-10-12 DIAGNOSIS — M1711 Unilateral primary osteoarthritis, right knee: Secondary | ICD-10-CM | POA: Diagnosis not present

## 2016-10-17 DIAGNOSIS — Z1231 Encounter for screening mammogram for malignant neoplasm of breast: Secondary | ICD-10-CM | POA: Diagnosis not present

## 2016-10-28 DIAGNOSIS — H6123 Impacted cerumen, bilateral: Secondary | ICD-10-CM | POA: Diagnosis not present

## 2017-02-04 ENCOUNTER — Encounter (HOSPITAL_COMMUNITY): Payer: Self-pay | Admitting: *Deleted

## 2017-02-04 ENCOUNTER — Other Ambulatory Visit: Payer: Self-pay | Admitting: Gastroenterology

## 2017-02-05 ENCOUNTER — Encounter (HOSPITAL_COMMUNITY): Payer: Self-pay

## 2017-02-05 NOTE — Progress Notes (Signed)
Dr. Winfield Cunas Anesthesia informed of pt flat lining after given propofol , after knee arthroscopic surgery

## 2017-02-05 NOTE — Progress Notes (Signed)
Spoke w/ Dr. Winfield Cunas anesthesia about pt. Stating she flat  lined after propofol. Dr. Winfield Cunas reviewed her case and said she had received  Propofol during procedures in 2015 and did fine. He stated he was comfortable with her getting propofol in a hospital setting   But  pt. absolutely did not want it she could get moderate sedation.

## 2017-02-06 NOTE — Progress Notes (Signed)
Spoke with Allison Hunt about her option of moderate sedation vs. Propofol.  Regarding her issue with profolol in 2013. Informed her I had spoken with anesthesia and they felt comfortable with  Administering propofol to her in a hospital setting but wanted her to be aware she had the option for moderate sedation. Pt. Stated she wanted propofol . And just wanted to speak with anesthesia the am of her procedure.

## 2017-02-17 ENCOUNTER — Other Ambulatory Visit: Payer: Self-pay | Admitting: Gastroenterology

## 2017-02-19 ENCOUNTER — Ambulatory Visit (HOSPITAL_COMMUNITY): Payer: PPO | Admitting: Anesthesiology

## 2017-02-19 ENCOUNTER — Encounter (HOSPITAL_COMMUNITY)
Admission: RE | Disposition: A | Discharge status: Home / Self Care | Payer: Self-pay | Source: Ambulatory Visit | Attending: Gastroenterology

## 2017-02-19 ENCOUNTER — Ambulatory Visit (HOSPITAL_COMMUNITY)
Admission: RE | Admit: 2017-02-19 | Discharge: 2017-02-19 | Disposition: A | Discharge status: Home / Self Care | Payer: PPO | Source: Ambulatory Visit | Attending: Gastroenterology | Admitting: Gastroenterology

## 2017-02-19 ENCOUNTER — Encounter (HOSPITAL_COMMUNITY): Payer: Self-pay | Admitting: *Deleted

## 2017-02-19 DIAGNOSIS — M4692 Unspecified inflammatory spondylopathy, cervical region: Secondary | ICD-10-CM | POA: Insufficient documentation

## 2017-02-19 DIAGNOSIS — Z1211 Encounter for screening for malignant neoplasm of colon: Secondary | ICD-10-CM | POA: Diagnosis not present

## 2017-02-19 DIAGNOSIS — M8580 Other specified disorders of bone density and structure, unspecified site: Secondary | ICD-10-CM | POA: Diagnosis not present

## 2017-02-19 DIAGNOSIS — Z87442 Personal history of urinary calculi: Secondary | ICD-10-CM | POA: Insufficient documentation

## 2017-02-19 DIAGNOSIS — I4891 Unspecified atrial fibrillation: Secondary | ICD-10-CM | POA: Diagnosis not present

## 2017-02-19 DIAGNOSIS — Z87891 Personal history of nicotine dependence: Secondary | ICD-10-CM | POA: Insufficient documentation

## 2017-02-19 DIAGNOSIS — Z90710 Acquired absence of both cervix and uterus: Secondary | ICD-10-CM | POA: Insufficient documentation

## 2017-02-19 DIAGNOSIS — Z8541 Personal history of malignant neoplasm of cervix uteri: Secondary | ICD-10-CM | POA: Diagnosis not present

## 2017-02-19 DIAGNOSIS — Z800 Family history of malignant neoplasm of digestive organs: Secondary | ICD-10-CM | POA: Insufficient documentation

## 2017-02-19 DIAGNOSIS — I341 Nonrheumatic mitral (valve) prolapse: Secondary | ICD-10-CM | POA: Insufficient documentation

## 2017-02-19 DIAGNOSIS — K5730 Diverticulosis of large intestine without perforation or abscess without bleeding: Secondary | ICD-10-CM | POA: Insufficient documentation

## 2017-02-19 DIAGNOSIS — E785 Hyperlipidemia, unspecified: Secondary | ICD-10-CM | POA: Diagnosis not present

## 2017-02-19 DIAGNOSIS — Z820 Family history of epilepsy and other diseases of the nervous system: Secondary | ICD-10-CM | POA: Insufficient documentation

## 2017-02-19 DIAGNOSIS — Z8249 Family history of ischemic heart disease and other diseases of the circulatory system: Secondary | ICD-10-CM | POA: Insufficient documentation

## 2017-02-19 HISTORY — PX: COLONOSCOPY WITH PROPOFOL: SHX5780

## 2017-02-19 HISTORY — DX: Personal history of urinary calculi: Z87.442

## 2017-02-19 HISTORY — DX: Anxiety disorder, unspecified: F41.9

## 2017-02-19 HISTORY — DX: Cardiac murmur, unspecified: R01.1

## 2017-02-19 SURGERY — COLONOSCOPY WITH PROPOFOL
Anesthesia: Monitor Anesthesia Care

## 2017-02-19 MED ORDER — SODIUM CHLORIDE 0.9 % IV SOLN: INTRAVENOUS | Status: DC

## 2017-02-19 MED ORDER — SCOPOLAMINE 1 MG/3DAYS TD PT72
1.0000 | MEDICATED_PATCH | Freq: Once | TRANSDERMAL | Status: DC
  Administered 2017-02-19: 1.5 mg via TRANSDERMAL

## 2017-02-19 MED ORDER — PROPOFOL 10 MG/ML IV BOLUS
INTRAVENOUS | Status: AC
Start: 2017-02-19 — End: ?
  Filled 2017-02-19: qty 20

## 2017-02-19 MED ORDER — PROPOFOL 10 MG/ML IV BOLUS
INTRAVENOUS | Status: DC | PRN
  Administered 2017-02-19: 20 mg via INTRAVENOUS
  Administered 2017-02-19: 30 mg via INTRAVENOUS
  Administered 2017-02-19: 20 mg via INTRAVENOUS

## 2017-02-19 MED ORDER — PROPOFOL 500 MG/50ML IV EMUL
INTRAVENOUS | Status: DC | PRN
  Administered 2017-02-19: 150 ug/kg/min via INTRAVENOUS

## 2017-02-19 MED ORDER — GLYCOPYRROLATE 0.2 MG/ML IV SOSY
PREFILLED_SYRINGE | INTRAVENOUS | Status: AC
  Filled 2017-02-19: qty 5

## 2017-02-19 MED ORDER — LACTATED RINGERS IV SOLN
INTRAVENOUS | Status: AC | PRN
  Administered 2017-02-19: 1000 mL via INTRAVENOUS

## 2017-02-19 MED ORDER — GLYCOPYRROLATE 0.2 MG/ML IJ SOLN
INTRAMUSCULAR | Status: DC | PRN
  Administered 2017-02-19: 0.2 mg via INTRAVENOUS

## 2017-02-19 MED ORDER — LIDOCAINE 2% (20 MG/ML) 5 ML SYRINGE
INTRAMUSCULAR | Status: DC | PRN
  Administered 2017-02-19: 75 mg via INTRAVENOUS

## 2017-02-19 MED ORDER — SCOPOLAMINE 1 MG/3DAYS TD PT72
MEDICATED_PATCH | TRANSDERMAL | Status: AC
  Filled 2017-02-19: qty 1

## 2017-02-19 MED ORDER — LIDOCAINE 2% (20 MG/ML) 5 ML SYRINGE
INTRAMUSCULAR | Status: AC
  Filled 2017-02-19: qty 5

## 2017-02-19 SURGICAL SUPPLY — 21 items

## 2017-02-19 NOTE — Anesthesia Postprocedure Evaluation (Signed)
Anesthesia Post Note  Patient: Allison Hunt  Procedure(s) Performed: Procedure(s) (LRB): COLONOSCOPY WITH PROPOFOL (N/A)     Patient location during evaluation: PACU Anesthesia Type: MAC Level of consciousness: awake and alert Pain management: pain level controlled Vital Signs Assessment: post-procedure vital signs reviewed and stable Respiratory status: spontaneous breathing, nonlabored ventilation, respiratory function stable and patient connected to nasal cannula oxygen Cardiovascular status: stable and blood pressure returned to baseline Anesthetic complications: no    Last Vitals:  Vitals:   02/19/17 1220 02/19/17 1230  BP: (!) 154/72 (!) 150/66  Pulse: 86 74  Resp: 15 13  Temp:    SpO2: 100% 99%    Last Pain:  Vitals:   02/19/17 1217  TempSrc: Oral                 Allison Hunt

## 2017-02-19 NOTE — Discharge Instructions (Signed)

## 2017-02-19 NOTE — Anesthesia Preprocedure Evaluation (Addendum)
Anesthesia Evaluation  Patient identified by MRN, date of birth, ID band Patient awake    Reviewed: Allergy & Precautions, H&P , Patient's Chart, lab work & pertinent test results, reviewed documented beta blocker date and time   History of Anesthesia Complications (+) PSEUDOCHOLINESTERASE DEFICIENCY  Airway Mallampati: II  TM Distance: >3 FB Neck ROM: full    Dental no notable dental hx.    Pulmonary former smoker,    Pulmonary exam normal breath sounds clear to auscultation       Cardiovascular  Rhythm:regular Rate:Normal     Neuro/Psych    GI/Hepatic   Endo/Other    Renal/GU      Musculoskeletal   Abdominal   Peds  Hematology   Anesthesia Other Findings   Reproductive/Obstetrics                             Anesthesia Physical Anesthesia Plan  ASA: II  Anesthesia Plan: MAC   Post-op Pain Management:    Induction: Intravenous  PONV Risk Score and Plan: Treatment may vary due to age or medical condition  Airway Management Planned: Mask and Natural Airway  Additional Equipment:   Intra-op Plan:   Post-operative Plan:   Informed Consent: I have reviewed the patients History and Physical, chart, labs and discussed the procedure including the risks, benefits and alternatives for the proposed anesthesia with the patient or authorized representative who has indicated his/her understanding and acceptance.   Dental Advisory Given  Plan Discussed with: CRNA and Surgeon  Anesthesia Plan Comments:        Anesthesia Quick Evaluation

## 2017-02-19 NOTE — H&P (Signed)
Patient interval history reviewed.  Patient examined again.  There has been no change from documented H/P dated 02/04/17 (scanned into chart from our office) except as documented above.  Assessment:  1.  Colon cancer screening. 2.  Prior history adverse effects from anesthesia.  Plan:  1.  Colonoscopy. 2.  Risks (bleeding, infection, bowel perforation that could require surgery, sedation-related changes in cardiopulmonary systems), benefits (identification and possible treatment of source of symptoms, exclusion of certain causes of symptoms), and alternatives (watchful waiting, radiographic imaging studies, empiric medical treatment) of colonoscopy were explained to patient/family in detail and patient wishes to proceed.

## 2017-02-19 NOTE — Transfer of Care (Signed)
Immediate Anesthesia Transfer of Care Note  Patient: Allison Hunt  Procedure(s) Performed: Procedure(s): COLONOSCOPY WITH PROPOFOL (N/A)  Patient Location: PACU and Endoscopy Unit  Anesthesia Type:MAC  Level of Consciousness: awake, alert  and patient cooperative  Airway & Oxygen Therapy: Patient Spontanous Breathing and Patient connected to nasal cannula oxygen  Post-op Assessment: Report given to RN and Post -op Vital signs reviewed and stable  Post vital signs: Reviewed and stable  Last Vitals:  Vitals:   02/19/17 1052  BP: (!) 146/93  Pulse: 81  Resp: 14  Temp: 36.4 C  SpO2: 97%    Last Pain:  Vitals:   02/19/17 1052  TempSrc: Oral         Complications: No apparent anesthesia complications

## 2017-02-19 NOTE — Op Note (Signed)
[facility] Patient Name: Allison Hunt Procedure Date: 02/19/2017 MRN: [id number] Attending MD: Arta Silence , MD Date of Birth: 09-18-1950 CSN: [id number] Age: 66 Admit Type: Outpatient Procedure:                Colonoscopy Indications:              Screening for colorectal malignant neoplasm, Last                            colonoscopy 10 years ago Providers:                Arta Silence, MD, Kingsley Plan, RN, Elspeth Cho Tech., Technician, Derrek Gu. Alday CRNA,                            CRNA Referring MD:              Medicines:                Monitored Anesthesia Care Complications:            No immediate complications. Estimated Blood Loss:     Estimated blood loss: none. Procedure:                Pre-Anesthesia Assessment:                           - Prior to the procedure, a History and Physical                            was performed, and patient medications and                            allergies were reviewed. The patient's tolerance of                            previous anesthesia was also reviewed. The risks                            and benefits of the procedure and the sedation                            options and risks were discussed with the patient.                            All questions were answered, and informed consent                            was obtained. Prior Anticoagulants: The patient has                            taken previous NSAID medication. ASA Grade                            Assessment: II - A patient  with mild systemic                            disease. After reviewing the risks and benefits,                            the patient was deemed in satisfactory condition to                            undergo the procedure.                           After obtaining informed consent, the colonoscope                            was passed under direct vision. Throughout the                   procedure, the patient's blood pressure, pulse, and                            oxygen saturations were monitored continuously. The                            EC-[id number] ([id number]) scope was introduced through                            the anus and advanced to the the cecum, identified                            by appendiceal orifice and ileocecal valve. The                            ileocecal valve, appendiceal orifice, and rectum                            were photographed. The entire colon was examined.                            The colonoscopy was performed without difficulty.                            The patient tolerated the procedure well. The                            quality of the bowel preparation was good. Scope In: 11:52:12 AM Scope Out: 12:08:40 PM Scope Withdrawal Time: 0 hours 7 minutes 59 seconds  Total Procedure Duration: 0 hours 16 minutes 28 seconds  Findings:      The perianal examination was normal.      A few medium-mouthed diverticula were found in the sigmoid colon.      Colon otherwise normal; no other polyps, masses, vascular ectasias, or       inflammatory changes were seen.      The retroflexed view of the distal rectum and anal verge was normal and  showed no anal or rectal abnormalities. Impression:               - Diverticulosis in the sigmoid colon.                           - The distal rectum and anal verge are normal on                            retroflexion view.                           - Otherwise normal colonoscopy Moderate Sedation:      None Recommendation:           - Patient has a contact number available for                            emergencies. The signs and symptoms of potential                            delayed complications were discussed with the                            patient. Return to normal activities tomorrow.                            Written discharge instructions were provided to the                             patient.                           - Discharge patient to home (via wheelchair).                           - High fiber diet indefinitely.                           - Continue present medications.                           - Repeat colonoscopy in 10 years for screening                            purposes.                           - Return to GI clinic PRN.                           - Return to referring physician as previously                            scheduled. Procedure Code(s):        --- Professional ---                           [phone],  Colonoscopy, flexible; diagnostic, including                            collection of specimen(s) by brushing or washing,                            when performed (separate procedure) Diagnosis Code(s):        --- Professional ---                           Z12.11, Encounter for screening for malignant                            neoplasm of colon                           K57.30, Diverticulosis of large intestine without                            perforation or abscess without bleeding CPT copyright 2016 American Medical Association. All rights reserved. The codes documented in this report are preliminary and upon coder review may  be revised to meet current compliance requirements. Arta Silence, MD 02/19/2017 12:18:47 PM This report has been signed electronically. Number of Addenda: 0

## 2017-02-20 ENCOUNTER — Encounter (HOSPITAL_COMMUNITY): Payer: Self-pay | Admitting: Gastroenterology

## 2017-03-13 DIAGNOSIS — H6123 Impacted cerumen, bilateral: Secondary | ICD-10-CM | POA: Diagnosis not present

## 2017-03-13 DIAGNOSIS — H02402 Unspecified ptosis of left eyelid: Secondary | ICD-10-CM | POA: Diagnosis not present

## 2017-04-03 DIAGNOSIS — Z0000 Encounter for general adult medical examination without abnormal findings: Secondary | ICD-10-CM | POA: Diagnosis not present

## 2017-04-08 DIAGNOSIS — M170 Bilateral primary osteoarthritis of knee: Secondary | ICD-10-CM | POA: Diagnosis not present

## 2017-04-08 DIAGNOSIS — K824 Cholesterolosis of gallbladder: Secondary | ICD-10-CM | POA: Diagnosis not present

## 2017-04-08 DIAGNOSIS — F5101 Primary insomnia: Secondary | ICD-10-CM | POA: Diagnosis not present

## 2017-04-08 DIAGNOSIS — Z0000 Encounter for general adult medical examination without abnormal findings: Secondary | ICD-10-CM | POA: Diagnosis not present

## 2017-04-08 DIAGNOSIS — E782 Mixed hyperlipidemia: Secondary | ICD-10-CM | POA: Diagnosis not present

## 2017-04-22 DIAGNOSIS — M8589 Other specified disorders of bone density and structure, multiple sites: Secondary | ICD-10-CM | POA: Diagnosis not present

## 2017-04-23 DIAGNOSIS — H02413 Mechanical ptosis of bilateral eyelids: Secondary | ICD-10-CM | POA: Diagnosis not present

## 2017-04-23 DIAGNOSIS — H02422 Myogenic ptosis of left eyelid: Secondary | ICD-10-CM | POA: Diagnosis not present

## 2017-05-26 DIAGNOSIS — H02413 Mechanical ptosis of bilateral eyelids: Secondary | ICD-10-CM | POA: Diagnosis not present

## 2017-05-26 DIAGNOSIS — H02422 Myogenic ptosis of left eyelid: Secondary | ICD-10-CM | POA: Diagnosis not present

## 2017-06-26 ENCOUNTER — Encounter (HOSPITAL_BASED_OUTPATIENT_CLINIC_OR_DEPARTMENT_OTHER): Admission: RE | Payer: Self-pay | Source: Ambulatory Visit

## 2017-06-26 ENCOUNTER — Ambulatory Visit (HOSPITAL_BASED_OUTPATIENT_CLINIC_OR_DEPARTMENT_OTHER): Admission: RE | Admit: 2017-06-26 | Payer: PPO | Source: Ambulatory Visit | Admitting: Oculoplastics Ophthalmology

## 2017-06-26 SURGERY — BLEPHAROPLASTY
Anesthesia: General | Laterality: Left

## 2017-07-24 ENCOUNTER — Ambulatory Visit (HOSPITAL_COMMUNITY): Admission: RE | Admit: 2017-07-24 | Payer: PPO | Source: Ambulatory Visit | Admitting: Oculoplastics Ophthalmology

## 2017-07-24 ENCOUNTER — Encounter (HOSPITAL_COMMUNITY): Admission: RE | Payer: Self-pay | Source: Ambulatory Visit

## 2017-07-24 SURGERY — BLEPHAROPLASTY
Anesthesia: General | Laterality: Left

## 2017-08-15 DIAGNOSIS — R7301 Impaired fasting glucose: Secondary | ICD-10-CM | POA: Diagnosis not present

## 2017-08-15 DIAGNOSIS — Z8679 Personal history of other diseases of the circulatory system: Secondary | ICD-10-CM | POA: Diagnosis not present

## 2017-08-15 DIAGNOSIS — K5730 Diverticulosis of large intestine without perforation or abscess without bleeding: Secondary | ICD-10-CM | POA: Diagnosis not present

## 2017-08-15 DIAGNOSIS — M8580 Other specified disorders of bone density and structure, unspecified site: Secondary | ICD-10-CM | POA: Diagnosis not present

## 2017-08-15 DIAGNOSIS — Z87442 Personal history of urinary calculi: Secondary | ICD-10-CM | POA: Diagnosis not present

## 2017-08-15 DIAGNOSIS — M5136 Other intervertebral disc degeneration, lumbar region: Secondary | ICD-10-CM | POA: Diagnosis not present

## 2017-08-15 DIAGNOSIS — E782 Mixed hyperlipidemia: Secondary | ICD-10-CM | POA: Diagnosis not present

## 2017-08-15 DIAGNOSIS — M545 Low back pain: Secondary | ICD-10-CM | POA: Diagnosis not present

## 2017-09-06 DIAGNOSIS — R55 Syncope and collapse: Secondary | ICD-10-CM | POA: Diagnosis not present

## 2017-09-26 DIAGNOSIS — H6123 Impacted cerumen, bilateral: Secondary | ICD-10-CM | POA: Diagnosis not present

## 2017-10-23 DIAGNOSIS — Z1231 Encounter for screening mammogram for malignant neoplasm of breast: Secondary | ICD-10-CM | POA: Diagnosis not present

## 2018-03-17 DIAGNOSIS — H6123 Impacted cerumen, bilateral: Secondary | ICD-10-CM | POA: Diagnosis not present

## 2018-03-31 DIAGNOSIS — Z01818 Encounter for other preprocedural examination: Secondary | ICD-10-CM | POA: Diagnosis not present

## 2018-03-31 DIAGNOSIS — H02413 Mechanical ptosis of bilateral eyelids: Secondary | ICD-10-CM | POA: Diagnosis not present

## 2018-03-31 DIAGNOSIS — H02422 Myogenic ptosis of left eyelid: Secondary | ICD-10-CM | POA: Diagnosis not present

## 2018-04-03 DIAGNOSIS — K7689 Other specified diseases of liver: Secondary | ICD-10-CM | POA: Diagnosis not present

## 2018-04-03 DIAGNOSIS — E559 Vitamin D deficiency, unspecified: Secondary | ICD-10-CM | POA: Diagnosis not present

## 2018-04-03 DIAGNOSIS — R7301 Impaired fasting glucose: Secondary | ICD-10-CM | POA: Diagnosis not present

## 2018-04-03 DIAGNOSIS — Z8679 Personal history of other diseases of the circulatory system: Secondary | ICD-10-CM | POA: Diagnosis not present

## 2018-04-03 DIAGNOSIS — E782 Mixed hyperlipidemia: Secondary | ICD-10-CM | POA: Diagnosis not present

## 2018-04-09 DIAGNOSIS — M170 Bilateral primary osteoarthritis of knee: Secondary | ICD-10-CM | POA: Diagnosis not present

## 2018-04-09 DIAGNOSIS — M8580 Other specified disorders of bone density and structure, unspecified site: Secondary | ICD-10-CM | POA: Diagnosis not present

## 2018-04-09 DIAGNOSIS — E782 Mixed hyperlipidemia: Secondary | ICD-10-CM | POA: Diagnosis not present

## 2018-04-09 DIAGNOSIS — K824 Cholesterolosis of gallbladder: Secondary | ICD-10-CM | POA: Diagnosis not present

## 2018-04-09 DIAGNOSIS — K5730 Diverticulosis of large intestine without perforation or abscess without bleeding: Secondary | ICD-10-CM | POA: Diagnosis not present

## 2018-04-09 DIAGNOSIS — K7689 Other specified diseases of liver: Secondary | ICD-10-CM | POA: Diagnosis not present

## 2018-04-09 DIAGNOSIS — E559 Vitamin D deficiency, unspecified: Secondary | ICD-10-CM | POA: Diagnosis not present

## 2018-04-09 DIAGNOSIS — M4124 Other idiopathic scoliosis, thoracic region: Secondary | ICD-10-CM | POA: Diagnosis not present

## 2018-04-09 DIAGNOSIS — F5101 Primary insomnia: Secondary | ICD-10-CM | POA: Diagnosis not present

## 2018-04-09 DIAGNOSIS — Z87442 Personal history of urinary calculi: Secondary | ICD-10-CM | POA: Diagnosis not present

## 2018-04-09 DIAGNOSIS — Z0000 Encounter for general adult medical examination without abnormal findings: Secondary | ICD-10-CM | POA: Diagnosis not present

## 2018-04-15 ENCOUNTER — Encounter (HOSPITAL_COMMUNITY): Payer: Self-pay | Admitting: *Deleted

## 2018-04-15 ENCOUNTER — Other Ambulatory Visit: Payer: Self-pay

## 2018-04-15 NOTE — Progress Notes (Signed)
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 EP 05/19/12: avoid noxious stimuli, but otherwise no additional medical therapy for her autonomic dysfunction.).   - On 10/08/11 while in PACU (see 10/08/11 note by Ledon Snare, RN) following right knee arthroscopy, she felt nauseated.  HOB was raised and patient went into asystole without a pulse.  Chest compressions started immediately (required for < 1 minute) but prior to bagging she opened eyes spontaneously and began speaking. Eleven minutes later she again felt nauseated, EKG showed aystole, absent radial pulse, and chest compressions begun for < 1 min and she spontaneously opened her eyes, looked around and vomited. Post CPR EKG showed brief afib with RVR/PAF.  Defibrillation pads were placed, and she was transferred to [facility] for ICU admission and evaluation by cardiology. Asystole was ultimately felt likely due to increased vagal tone in the setting of post-operative N/V. She reported similar episode with medical procedures in the past or with the use of Demerol. - She had not anesthesia complications with  7/70/34 left knee arthroscopy or 02/19/17 colonoscopy.  Would favor EKG on the day of surgery if she has not had one within the past year given her past medical history of PDA repair, syncope, and brief bradycardia/asystole with PAF post operatively in 2013. She is a same day work-up, so further evalution by her anesthesia team on the day of surgery.    PROVIDERS: Baruch Goldmann, PA-C is PCP ([place]). Last visit 04/09/18 for Medicare Wellness Exam. He is aware of upcoming surgery. - She is not currently followed by cardiology, but she was seen by Mertie Moores, MD and Cristopher Peru, MD during her 10/08/11-10/10/11 admission for post-operative asystole (see above). At her last EP visit with Dr. Lovena Le on 05/19/12, he recommended avoid noxious stimuli but otherwise no additional medical therapy for her autonomic dysfunction.  Prior to 2015 left knee arthroscopy, Richardson Dopp, PA-C wrote, "Given her prior history, caution should be used with anesthetics and paralytics as well as pain control." She was last seen on 02/09/15 by Bishop Limbo, DO with [facility] (now [facility]). He did not recommend additional cardiac testing at that time and recommended PRN cardiology follow-up.   LABS: She had labs through her PCP on 04/03/18 ([place]). Results included: WBC 6.7 4.0 - 10.5 x 10*3/uL  RBC 4.44 3.87 - 5.11 x 10*6/uL  Hemoglobin 13.2 12.0 - 15.0 G/DL  Hematocrit 40.3 36.0 - 46.0 %  MCV 90.7 78.0 - 100.0 FL  MCH 29.6 26.0 - 34.0 PG  MCHC 32.7 30.0 - 36.0 G/DL  RDW 13.3 11.5 - 15.5 %  Platelets 349 150 - 400 X 10*3/uL  MPV 7.6 7.2 - 11.0 FL  Neutrophil % 75 %  Lymphocyte % 17 %  Monocyte % 5 %  Eosinophil % 2 %  Basophil % 1 %  Neutrophil Absolute 5.0 1.7 - 7.7 x 10*3/uL  Lymphocyte Absolute 1.2 0.7 - 4.0 x 10*3/uL  Monocyte Absolute 0.4 0.1 - 1.0 x 10*3/uL  Eosinophil Absolute 0.2 0.0 - 0.7 x 10*3/uL  Basophil Absolute 0.0 0.0 - 0.1  x 10*3/uL   Sodium 142 135 - 146 MMOL/L  Potassium 3.8 3.5 - 5.3 MMOL/L  Chloride 106 98 - 110 MMOL/L  CO2 31 (H) 23 - 30 MMOL/L  BUN 18 8 - 24 MG/DL  Glucose 99 70 - 99 MG/DL  Creatinine 0.63 0.50 - 1.50 MG/DL  Calcium 8.7 8.5 - 10.5 MG/DL  Total Protein 6.9 6.0 - 8.3 G/DL  Albumin  4.2 3.5 - 5.0 G/DL  Total Bilirubin 0.4 0.1 - 1.2 MG/DL  Alkaline Phosphatase 73 25 - 125 IU/L or U/L  AST (SGOT) 18 5 - 40 IU/L or U/L  ALT (SGPT) 14 5 - 50 IU/L or U/L  Anion Gap 5 4 - 14 MMOL/L  Est. GFR Non-African American >=90Comment: GFR estimated by CKD-EPI equations, reportable up to 90 ML/MIN/1.73 M*2 >=60 ML/MIN/1.73 M*2   Est. GFR African American >=90Comment: GFR estimated by CKD-EPI equations, reportable up to 90 ML/MIN/1.73 M*2    Component Value Ref Range  HEMOGLOBIN A1C 5.5 Comment:  Normal:    Less than 5.7% Prediabetes: 5.7% to 6.4% Diabetes:   Greater than 6.4% <5.7 %    IMAGES: MRA head and neck 09/06/17 ([place]; done for recurrent syncope in the setting of N/V): IMPRESSION: Negative neck and intracranial MRA.   EKG: Favor EKG on the day of surgery if not done within the past year. She had an incomplete right BBB on 12/23/13 EKG.   CV: Echo 10/08/11: Study Conclusions - Left ventricle: The cavity size was normal. Wall thickness was increased in a pattern of mild LVH. Systolic function was normal. The estimated ejection fraction was in the range of 60% to 65%. Wall motion was normal; there were no regional wall motion abnormalities. - Aortic valve: There was no stenosis. - Mitral valve: Trivial regurgitation. - Right ventricle: The cavity size was normal. Systolic function was normal. - Pulmonary arteries: PA peak pressure: 74mm Hg (S). - Inferior vena cava: The vessel was normal in size; the respirophasic diameter changes were in the normal range (= 50%); findings are consistent with normal central venous pressure. - Pericardium,  extracardiac: A trivial pericardial effusion was identified posterior to the heart. Impressions: - Normal LV size with mild LV hypertrophy. EF 60-65%. Normal RV size and systolic function. No significant valvular abnormalities.   Past Medical History:  Diagnosis Date  . Anxiety   . Arthritis    osteoarthritis both knees  . Cardiac asystole ([place])    a.  Felt to be 2/2 high vagal tone in setting of knee surgery;  b. 09/2011 Echo: EF 60-65%.  . Cervical cancer ([place]) 1984  . 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 right knee - s/p Knee surgery 09/2011  . Complication of anesthesia    a. Vasovagal syncope with bradycardia and asystole requiring brief chest compression 02/07/12 & hypotension  . Dental crowns present   . Fibromyalgia   . Gallbladder polyp   . 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  History of kidney stones   . Hypercholesterolemia   . Insomnia   . Lateral meniscal tear 09/2011   a. right knee  . Liver hemangioma   . Mechanical ptosis of bilateral eyelids    upper eyelids  . Medial meniscus tear 09/2011   a. right knee  . Mitral valve prolapse   . Myogenic ptosis    left upper eyelid  . Paroxysmal atrial fibrillation ([place])    a. 09/2011 in setting of post-op knee  . PONV (postoperative nausea and vomiting)    flat line after prophol, hx of bp droppin also  . Positional vertigo   . Shortness of breath    with exertion  resolved  . Syncope, vasovagal 2005   a. exacerbated by pain/surgical procedures    Past Surgical History:  Procedure Laterality Date  . COLONOSCOPY WITH PROPOFOL N/A 02/19/2017   Procedure: COLONOSCOPY WITH PROPOFOL;  Surgeon: Arta Silence, MD;  Location: [facility];  Service: Endoscopy;  Laterality: N/A;  . DILATION AND CURETTAGE OF UTERUS    . KIDNEY STONE SURGERY     denies  . KNEE ARTHROSCOPY  10/08/2011   Procedure: ARTHROSCOPY KNEE;  Surgeon: [name], MD;   Location: [facility];  Service: Orthopedics;  Laterality: Right;  right knee arthroscopy partial medial and lateral menisectomy and chondroplasty  . KNEE ARTHROSCOPY Left 01/04/2014   Procedure: ARTHROSCOPY KNEE;  Surgeon: [name], MD;  Location: [facility];  Service: Orthopedics;  Laterality: Left;  Patient flatlined at outpatient surgery center when she had same surgery a year ago ended up in ICU for two days has had cardiac issues since 2013.  Marland Kitchen PATENT DUCTUS ARTERIOUS REPAIR  1956  . TOTAL ABDOMINAL HYSTERECTOMY  1991   due to cervical cancer  . TUBAL LIGATION      MEDICATIONS: No current facility-administered medications for this encounter.    Marland Kitchen atorvastatin (LIPITOR) 10 MG tablet  . Calcium Carb-Cholecalciferol (CALCIUM 600+D3) 600-800 MG-UNIT TABS  . diclofenac (VOLTAREN) 75 MG EC tablet  . PREVIDENT 0.2 % SOLN  . raloxifene (EVISTA) 60 MG tablet  . zaleplon (SONATA) 10 MG capsule  . erythromycin ophthalmic ointment  . neomycin-polymyxin b-dexamethasone (MAXITROL) 3.5-10000-0.1 SUSP    [facility] Short Stay Center/Anesthesiology Phone [phone] 04/15/2018 1:43 PM

## 2018-04-15 NOTE — Anesthesia Preprocedure Evaluation (Addendum)
Anesthesia Evaluation  Patient identified by MRN, date of birth, ID band Patient awake    Reviewed: Allergy & Precautions, NPO status , Patient's Chart, lab work & pertinent test results  History of Anesthesia Complications (+) PONV  Airway Mallampati: I       Dental no notable dental hx. (+) Teeth Intact   Pulmonary former smoker,    Pulmonary exam normal breath sounds clear to auscultation       Cardiovascular hypertension, Normal cardiovascular exam Rhythm:Regular Rate:Normal     Neuro/Psych    GI/Hepatic negative GI ROS, Neg liver ROS,   Endo/Other  negative endocrine ROS  Renal/GU negative Renal ROS     Musculoskeletal   Abdominal Normal abdominal exam  (+)   Peds  Hematology negative hematology ROS (+)   Anesthesia Other Findings   Reproductive/Obstetrics                            Anesthesia Physical Anesthesia Plan  ASA: II  Anesthesia Plan: General   Post-op Pain Management:    Induction: Intravenous  PONV Risk Score and Plan: 3 and Ondansetron and Dexamethasone  Airway Management Planned: Oral ETT  Additional Equipment:   Intra-op Plan:   Post-operative Plan: Extubation in OR  Informed Consent: I have reviewed the patients History and Physical, chart, labs and discussed the procedure including the risks, benefits and alternatives for the proposed anesthesia with the patient or authorized representative who has indicated his/her understanding and acceptance.   Dental advisory given  Plan Discussed with: CRNA and Surgeon  Anesthesia Plan Comments: (PAT note written 04/15/2018 by Myra Gianotti, PA-C. See event details following 2013 surgery.  )       Anesthesia Quick Evaluation

## 2018-04-15 NOTE — Progress Notes (Signed)
Pt denies SOB, chest pain, and being under the care of a cardiologist. Pt denies having a stress test and cardiac cath. Pt denies having an EKG and chest x ray within the last year. Pt stated that she was instructed by MD to stop taking Aspirin and Voltaren. Pt also made aware to stop taking vitamins, fish oil and herbal medications. Do not take any NSAIDs ie: Ibuprofen, Advil, Naproxen (Aleve), Motrin, BC and Goody Powder. Pt verbalized understanding of all pre-op instructions. See pt labs in Care Everywhere. Anesthesia asked to review pt history.

## 2018-04-16 ENCOUNTER — Encounter (HOSPITAL_COMMUNITY)
Admission: RE | Disposition: A | Discharge status: Home / Self Care | Payer: Self-pay | Source: Ambulatory Visit | Attending: Oculoplastics Ophthalmology

## 2018-04-16 ENCOUNTER — Ambulatory Visit (HOSPITAL_COMMUNITY): Payer: PPO | Admitting: Vascular Surgery

## 2018-04-16 ENCOUNTER — Ambulatory Visit (HOSPITAL_COMMUNITY)
Admission: RE | Admit: 2018-04-16 | Discharge: 2018-04-16 | Disposition: A | Discharge status: Home / Self Care | Payer: PPO | Source: Ambulatory Visit | Attending: Oculoplastics Ophthalmology | Admitting: Oculoplastics Ophthalmology

## 2018-04-16 ENCOUNTER — Encounter (HOSPITAL_COMMUNITY): Payer: Self-pay

## 2018-04-16 ENCOUNTER — Other Ambulatory Visit: Payer: Self-pay

## 2018-04-16 DIAGNOSIS — Z87891 Personal history of nicotine dependence: Secondary | ICD-10-CM | POA: Diagnosis not present

## 2018-04-16 DIAGNOSIS — H02422 Myogenic ptosis of left eyelid: Secondary | ICD-10-CM | POA: Diagnosis not present

## 2018-04-16 DIAGNOSIS — D492 Neoplasm of unspecified behavior of bone, soft tissue, and skin: Secondary | ICD-10-CM | POA: Diagnosis not present

## 2018-04-16 DIAGNOSIS — I10 Essential (primary) hypertension: Secondary | ICD-10-CM | POA: Diagnosis not present

## 2018-04-16 DIAGNOSIS — Z79899 Other long term (current) drug therapy: Secondary | ICD-10-CM | POA: Diagnosis not present

## 2018-04-16 DIAGNOSIS — H02413 Mechanical ptosis of bilateral eyelids: Secondary | ICD-10-CM | POA: Insufficient documentation

## 2018-04-16 DIAGNOSIS — I4891 Unspecified atrial fibrillation: Secondary | ICD-10-CM | POA: Diagnosis not present

## 2018-04-16 HISTORY — PX: PTOSIS REPAIR: SHX6568

## 2018-04-16 HISTORY — PX: BROW LIFT: SHX178

## 2018-04-16 HISTORY — DX: Myogenic ptosis of unspecified eyelid: H02.429

## 2018-04-16 HISTORY — DX: Mechanical ptosis of bilateral eyelids: H02.413

## 2018-04-16 SURGERY — BLEPHAROPLASTY
Anesthesia: General | Site: Eye | Laterality: Left

## 2018-04-16 MED ORDER — TOBRAMYCIN-DEXAMETHASONE 0.3-0.1 % OP OINT
TOPICAL_OINTMENT | OPHTHALMIC | Status: DC | PRN
  Administered 2018-04-16: 1 via OPHTHALMIC

## 2018-04-16 MED ORDER — MIDAZOLAM HCL 2 MG/2ML IJ SOLN
INTRAMUSCULAR | Status: AC
  Filled 2018-04-16: qty 2

## 2018-04-16 MED ORDER — FENTANYL CITRATE (PF) 100 MCG/2ML IJ SOLN
INTRAMUSCULAR | Status: DC | PRN
  Administered 2018-04-16: 100 ug via INTRAVENOUS

## 2018-04-16 MED ORDER — DEXAMETHASONE SODIUM PHOSPHATE 10 MG/ML IJ SOLN
INTRAMUSCULAR | Status: DC | PRN
  Administered 2018-04-16: 10 mg via INTRAVENOUS

## 2018-04-16 MED ORDER — BSS IO SOLN
INTRAOCULAR | Status: AC
  Filled 2018-04-16: qty 15

## 2018-04-16 MED ORDER — SCOPOLAMINE 1 MG/3DAYS TD PT72
MEDICATED_PATCH | TRANSDERMAL | Status: AC
  Filled 2018-04-16: qty 1

## 2018-04-16 MED ORDER — ROCURONIUM BROMIDE 10 MG/ML (PF) SYRINGE
PREFILLED_SYRINGE | INTRAVENOUS | Status: DC | PRN
  Administered 2018-04-16: 10 mg via INTRAVENOUS
  Administered 2018-04-16: 40 mg via INTRAVENOUS
  Administered 2018-04-16: 10 mg via INTRAVENOUS

## 2018-04-16 MED ORDER — LIDOCAINE HCL 2 % IJ SOLN
INTRAMUSCULAR | Status: AC
  Filled 2018-04-16: qty 20

## 2018-04-16 MED ORDER — ACETAMINOPHEN 325 MG PO TABS: 325.0000 mg | ORAL_TABLET | ORAL | Status: DC | PRN

## 2018-04-16 MED ORDER — ROCURONIUM BROMIDE 50 MG/5ML IV SOSY
PREFILLED_SYRINGE | INTRAVENOUS | Status: AC
  Filled 2018-04-16: qty 10

## 2018-04-16 MED ORDER — MIDAZOLAM HCL 2 MG/2ML IJ SOLN
INTRAMUSCULAR | Status: DC | PRN
  Administered 2018-04-16: 2 mg via INTRAVENOUS

## 2018-04-16 MED ORDER — TETRACAINE HCL 0.5 % OP SOLN
OPHTHALMIC | Status: AC
  Filled 2018-04-16: qty 4

## 2018-04-16 MED ORDER — LIDOCAINE-EPINEPHRINE 1 %-1:100000 IJ SOLN
INTRAMUSCULAR | Status: AC
  Filled 2018-04-16: qty 1

## 2018-04-16 MED ORDER — FENTANYL CITRATE (PF) 250 MCG/5ML IJ SOLN
INTRAMUSCULAR | Status: AC
  Filled 2018-04-16: qty 5

## 2018-04-16 MED ORDER — DEXAMETHASONE SODIUM PHOSPHATE 10 MG/ML IJ SOLN
INTRAMUSCULAR | Status: AC
  Filled 2018-04-16: qty 1

## 2018-04-16 MED ORDER — BUPIVACAINE HCL (PF) 0.5 % IJ SOLN
INTRAMUSCULAR | Status: AC
  Filled 2018-04-16: qty 10

## 2018-04-16 MED ORDER — 0.9 % SODIUM CHLORIDE (POUR BTL) OPTIME
TOPICAL | Status: DC | PRN
  Administered 2018-04-16: 1000 mL

## 2018-04-16 MED ORDER — LIDOCAINE 2% (20 MG/ML) 5 ML SYRINGE
INTRAMUSCULAR | Status: DC | PRN
  Administered 2018-04-16: 100 mg via INTRAVENOUS

## 2018-04-16 MED ORDER — PHENYLEPHRINE HCL 10 MG/ML IJ SOLN
INTRAMUSCULAR | Status: DC | PRN
  Administered 2018-04-16: 40 ug via INTRAVENOUS
  Administered 2018-04-16: 80 ug via INTRAVENOUS

## 2018-04-16 MED ORDER — FENTANYL CITRATE (PF) 100 MCG/2ML IJ SOLN: 25.0000 ug | INTRAMUSCULAR | Status: DC | PRN

## 2018-04-16 MED ORDER — PROPOFOL 10 MG/ML IV BOLUS
INTRAVENOUS | Status: DC | PRN
  Administered 2018-04-16: 150 mg via INTRAVENOUS

## 2018-04-16 MED ORDER — TOBRAMYCIN-DEXAMETHASONE 0.3-0.1 % OP OINT
TOPICAL_OINTMENT | OPHTHALMIC | Status: AC
  Filled 2018-04-16: qty 3.5

## 2018-04-16 MED ORDER — PHENYLEPHRINE HCL 2.5 % OP SOLN
OPHTHALMIC | Status: AC
  Filled 2018-04-16: qty 2

## 2018-04-16 MED ORDER — LACTATED RINGERS IV SOLN
INTRAVENOUS | Status: DC | PRN
  Administered 2018-04-16: 11:00:00 via INTRAVENOUS

## 2018-04-16 MED ORDER — ONDANSETRON HCL 4 MG/2ML IJ SOLN: 4.0000 mg | Freq: Once | INTRAMUSCULAR | Status: DC | PRN

## 2018-04-16 MED ORDER — BUPIVACAINE HCL (PF) 0.75 % IJ SOLN
INTRAMUSCULAR | Status: DC | PRN
  Administered 2018-04-16: 10 mL

## 2018-04-16 MED ORDER — ONDANSETRON HCL 4 MG/2ML IJ SOLN
INTRAMUSCULAR | Status: DC | PRN
  Administered 2018-04-16: 4 mg via INTRAVENOUS

## 2018-04-16 MED ORDER — LIDOCAINE-EPINEPHRINE 2 %-1:100000 IJ SOLN
INTRAMUSCULAR | Status: AC
  Filled 2018-04-16: qty 1

## 2018-04-16 MED ORDER — LIDOCAINE-EPINEPHRINE 1 %-1:100000 IJ SOLN
INTRAMUSCULAR | Status: DC | PRN
  Administered 2018-04-16: 10 mL

## 2018-04-16 MED ORDER — LIDOCAINE 2% (20 MG/ML) 5 ML SYRINGE
INTRAMUSCULAR | Status: AC
  Filled 2018-04-16: qty 5

## 2018-04-16 MED ORDER — BUPIVACAINE HCL (PF) 0.75 % IJ SOLN
INTRAMUSCULAR | Status: AC
  Filled 2018-04-16: qty 10

## 2018-04-16 MED ORDER — GLYCOPYRROLATE 0.2 MG/ML IJ SOLN
INTRAMUSCULAR | Status: DC | PRN
  Administered 2018-04-16: 0.2 mg via INTRAVENOUS

## 2018-04-16 MED ORDER — SCOPOLAMINE 1 MG/3DAYS TD PT72
MEDICATED_PATCH | TRANSDERMAL | Status: DC | PRN
  Administered 2018-04-16: 1 via TRANSDERMAL

## 2018-04-16 MED ORDER — SUGAMMADEX SODIUM 200 MG/2ML IV SOLN
INTRAVENOUS | Status: DC | PRN
  Administered 2018-04-16: 270 mg via INTRAVENOUS

## 2018-04-16 MED ORDER — LACTATED RINGERS IV SOLN
INTRAVENOUS | Status: DC
  Administered 2018-04-16: 08:00:00 via INTRAVENOUS

## 2018-04-16 MED ORDER — ONDANSETRON HCL 4 MG/2ML IJ SOLN
INTRAMUSCULAR | Status: AC
  Filled 2018-04-16: qty 2

## 2018-04-16 MED ORDER — HYALURONIDASE HUMAN 150 UNIT/ML IJ SOLN
INTRAMUSCULAR | Status: AC
  Filled 2018-04-16: qty 1

## 2018-04-16 MED ORDER — OXYCODONE HCL 5 MG PO TABS: 5.0000 mg | ORAL_TABLET | Freq: Once | ORAL | Status: DC | PRN

## 2018-04-16 MED ORDER — GLYCOPYRROLATE PF 0.2 MG/ML IJ SOSY
PREFILLED_SYRINGE | INTRAMUSCULAR | Status: AC
  Filled 2018-04-16: qty 2

## 2018-04-16 MED ORDER — ACETAMINOPHEN 160 MG/5ML PO SOLN: 325.0000 mg | ORAL | Status: DC | PRN

## 2018-04-16 MED ORDER — OXYCODONE HCL 5 MG/5ML PO SOLN: 5.0000 mg | Freq: Once | ORAL | Status: DC | PRN

## 2018-04-16 SURGICAL SUPPLY — 38 items
APL SRG 3 HI ABS STRL LF PLS (MISCELLANEOUS) ×2
APPLICATOR COTTON TIP 6IN STRL (MISCELLANEOUS) ×4 IMPLANT
APPLICATOR DR MATTHEWS STRL (MISCELLANEOUS) ×4 IMPLANT
BLADE SURG 15 STRL LF DISP TIS (BLADE) ×2 IMPLANT
BLADE SURG 15 STRL SS (BLADE) ×4
CLOSURE STERI-STRIP 1/2X4 (GAUZE/BANDAGES/DRESSINGS) ×1
CLOSURE WOUND 1/2 X4 (GAUZE/BANDAGES/DRESSINGS) ×1
CLSR STERI-STRIP ANTIMIC 1/2X4 (GAUZE/BANDAGES/DRESSINGS) ×3 IMPLANT
CORDS BIPOLAR (ELECTRODE) ×4 IMPLANT
COVER SURGICAL LIGHT HANDLE (MISCELLANEOUS) ×4 IMPLANT
COVER WAND RF STERILE (DRAPES) ×4 IMPLANT
DRAPE ORTHO SPLIT 87X125 STRL (DRAPES) ×4 IMPLANT
DRAPE SURG 17X23 STRL (DRAPES) ×8 IMPLANT
DRAPE UTILITY XL STRL (DRAPES) ×4 IMPLANT
ELECT NEEDLE BLADE 2-5/6 (NEEDLE) ×4 IMPLANT
ELECT REM PT RETURN 9FT ADLT (ELECTROSURGICAL) ×4
ELECTRODE REM PT RTRN 9FT ADLT (ELECTROSURGICAL) ×2 IMPLANT
FORCEPS BIPOLAR SPETZLER 8 1.0 (NEUROSURGERY SUPPLIES) ×4 IMPLANT
GLOVE BIO SURGEON STRL SZ7.5 (GLOVE) ×8 IMPLANT
GOWN STRL REUS W/ TWL LRG LVL3 (GOWN DISPOSABLE) ×4 IMPLANT
GOWN STRL REUS W/TWL LRG LVL3 (GOWN DISPOSABLE) ×8
KIT BASIN OR (CUSTOM PROCEDURE TRAY) ×4 IMPLANT
NEEDLE HYPO 30X.5 LL (NEEDLE) IMPLANT
NEEDLE PRECISIONGLIDE 27X1.5 (NEEDLE) IMPLANT
NS IRRIG 1000ML POUR BTL (IV SOLUTION) ×4 IMPLANT
PACK CATARACT CUSTOM (CUSTOM PROCEDURE TRAY) ×4 IMPLANT
PAD ARMBOARD 7.5X6 YLW CONV (MISCELLANEOUS) ×8 IMPLANT
PENCIL BUTTON HOLSTER BLD 10FT (ELECTRODE) ×4 IMPLANT
STRIP CLOSURE SKIN 1/2X4 (GAUZE/BANDAGES/DRESSINGS) ×3 IMPLANT
SUT CHROMIC 5 0 P 3 (SUTURE) ×4 IMPLANT
SUT ETHILON 7 0 P 1 (SUTURE) IMPLANT
SUT ETHILON 7 0 P 6 18 (SUTURE) ×4 IMPLANT
SUT MERSILENE 5 0 RD 1 DA (SUTURE) ×4 IMPLANT
SUT PLAIN 5 0 P 3 18 (SUTURE) ×4 IMPLANT
SUT SILK 4 0 P 3 (SUTURE) ×4 IMPLANT
SUT SILK 6 0 BV 1XDISCX (SUTURE) IMPLANT
TOWEL OR 17X24 6PK STRL BLUE (TOWEL DISPOSABLE) ×8 IMPLANT
WATER STERILE IRR 1000ML POUR (IV SOLUTION) ×4 IMPLANT

## 2018-04-16 NOTE — Op Note (Signed)
Procedure(s): BLEPHAROPLASTY BOTH UPPER LIDS PTOSIS REPAIR LEFT EYE Procedure Note  Allison Hunt female 67 y.o. 04/16/2018  Procedure(s) and Anesthesia Type:    * BLEPHAROPLASTY BOTH UPPER LIDS - General    * PTOSIS REPAIR LEFT EYE - General *Excision of RIGHT LOWER Eyelid Lesion  Surgeon(s) and Role:    * Cashay Manganelli, Peyton Najjar, MD - Primary     Surgeon: [name]   Assistants: none  Anesthesia: General endotracheal anesthesia and Local anesthesia 1% buffered lidocaine, 0.5% bupivacaine, with epinephrine  ASA Class: 2  Procedure Detail  BLEPHAROPLASTY BOTH UPPER LIDS, PTOSIS REPAIR LEFT EYE and Excision of Lesion Right Lower Eyelid    INDICATIONS:  The patient has mechanical ptosis of the eyelids causing visual impairment due to superior visual field defect that interferes with tasks of daily living including reading and computer use. The MRD1 is less than 63mm.  Informed consent had been provided prior to the day of surgery at the patient's clinic visit. A handout was provided and the patient was asked to sign an eyelid informed consent specific to this procedure. All questions were answered.  The patient understands that the goal of surgery is to improve visual function. This is not a cosmetic blepharoplasty, so only the skin and fat that interrupts the vision will be excised. Prior to entering the operative suite, the general surgical consent was signed. All questions were answered.  The patient understands the risks of surgery include but are not limited to bleeding, infection, scar formation, asymmetry, the need for additional surgical intervention and other less common outcomes noted on the specific eyelid informed consent and anesthesia risks listed on the anesthesia consent. The patient accepts the risks and desires to proceed with surgery as the position of the eyelids on the eyelashes is irritated and blocking the superior visual field interrupting tasks of daily  living to include reading, driving and computer use.   Bilateral Upper Eyelid Blepharoplasty  PROCEDURE:  The patient was taken to the operating room and placed in the supine position with the usual monitoring in place. The patient was given a drop of Proparacaine 0.5%, then prepped and draped in the usual standard fashion. The upper eyelids had been previously marked meticulously along the eyelid crease and at the superior aspect of the intended eyelid incision with a surgical marking pen and injected with the standard oculoplastic block. Care was taken to use the standard pinch test to confirm that the surgically excised skin will not result in inability to close the eyelids postoperative. Globe protectors were placed in the eyes.  A #15 blade was used to incise the skin. A Westcott scissor and 0.5 forceps were used to excise the skin. Care was taken to preserve the orbicularis. A small amount of superolateral sub-brow orbicularis was excised to stimulate brow elevation. Hemostasis was achieved with a combination of monopolar and bipolar cautery. The fat was sculpted only to the extent that it was contributing to the mechanical ptosis.   Attention was turned to the contralateral eye where the same procedure was performed.   LEFT INTERNAL PTOSIS REPAIR In clinic, the patient had complete correction with one drop of phenylephrine. The eyelid was then everted over a desmarres retractor and injected with the standard oculoplastic block.  The goal was to perform a 7 mm tuck in the conjunctiva, so the conjunctiva was marked at 3.5 mm from the tarsal border in a medial, central and lateral position with three interrupted 6-0 silk sutures which were  then secured with a steristrip. The sutures were then pulled inferiorly and the putterman ptosis clamp was placed in standard fashion.  Using the Putterman ptosis clamp to provide traction, a 5-0 gut suture was passed through the upper lid laterally approximately  2 mm from the eyelashes and passed in a running fashion from lateral aspect of the Putterman ptosis clamp medially and back again laterally and then brought out through the skin adjacent to the entry location and tied externally to avoid corneal irritation.  The silk suture and approximately 1 mm of conjunctiva and mueller's muscle was excised in such a fashion to create a smooth internal wound edge. The external knot was then covered in the blepharoplasty incision.  The skin incision was closed with an interrupted 7-0 nylon suture and cleaned with hydrogen peroxide. No ointment was applied.  Excision of Right Lower Eyelid Lesion The patient is aware that they have a lesion of the right lower eyelid, necessitating excision. The patient is aware of the risks and benefits of surgery including bleeding, infection, scarring, need for re-excision, asymmetry, and loss of the eye, and loss of life. The patient's family elects to proceed.   Attention was turned to the right lower eyelid which was then infiltrated with local anesthesia.. The lesion was cauterized with Bovie Cautery. No specimen was sent.  Ointment was placed.   The globe protectors were removed.  No lagophthalmos was present; the eyelashes were slightly upturned as desired.   The patient tolerated the procedure well and was transferred to PACU in stable condition.    Findings:   Estimated Blood Loss:  Minimal         Drains: none         Total IV Fluids: <2L  Blood Given: none          Specimens: none         Implants: none        Complications:  * No complications entered in OR log *         Disposition: PACU - hemodynamically stable.         Condition: stable

## 2018-04-16 NOTE — Anesthesia Procedure Notes (Addendum)
Procedure Name: Intubation Date/Time: 04/16/2018 11:35 AM Performed by: Leonor Liv, CRNA Pre-anesthesia Checklist: Patient identified, Emergency Drugs available, Suction available and Patient being monitored Patient Re-evaluated:Patient Re-evaluated prior to induction Oxygen Delivery Method: Circle System Utilized Preoxygenation: Pre-oxygenation with 100% oxygen Induction Type: IV induction Ventilation: Mask ventilation without difficulty Laryngoscope Size: Mac and 3 Grade View: Grade I Tube type: Oral Tube size: 7.0 mm Number of attempts: 1 Airway Equipment and Method: Stylet and Oral airway Placement Confirmation: ETT inserted through vocal cords under direct vision,  positive ETCO2 and breath sounds checked- equal and bilateral Secured at: 21 (left ) cm Tube secured with: Tape Dental Injury: Teeth and Oropharynx as per pre-operative assessment

## 2018-04-16 NOTE — H&P (Signed)
Subjective:    Allison Hunt is a 67 y.o. female who presents for evaluation of mechanical ptosis ou and myogenic ptosis os and lesion od. The pain is described as none. Onset was several months ago. Symptoms have been unchanged since.   Review of Systems Pertinent items are noted in HPI.    Objective:   There were no vitals taken for this visit.  General:  alert, cooperative and appears stated age Skin:  normal Eyes: positive findings: eyelids/periorbital: ptosis bilaterally and lesion od Mouth: MMM no lesions Lymph Nodes:  Cervical, supraclavicular, and axillary nodes normal. Lungs:  clear to auscultation bilaterally Heart:  regular rate and rhythm, S1, S2 normal, no murmur, click, rub or gallop Abdomen: soft, non-tender; bowel sounds normal; no masses,  no organomegaly CVA:  absent Genitourinary: defer exam Extremities:  extremities normal, atraumatic, no cyanosis or edema Neurologic:  negative Psychiatric:  normal mood, behavior, speech, dress, and thought processes    Assessment: Mechanical Ptosis OU and Myogenic Ptosis OS, Lesion OD Plan: Bilateral Upper Eyelid Blepharoplasty and Internal Ptosis OS, Excisional Biopsy OD  1. Discussed the risk of surgery,  and the risks of general anesthetic including MI, CVA, sudden death or even reaction to anesthetic medications. The patient understands the risks, any and all questions were answered to the patient's satisfaction. 2. Follow up: 1 week.  Date of Surgery Update (To be completed by Attending Surgeon day of surgery.)

## 2018-04-16 NOTE — Discharge Instructions (Addendum)
Place ice on bilateral upper eyelid for 20 mins every 2 hours for 2 days. Place ointment(Erythromycin Ophthalmic Ointment) in LEFT eye at nighttime for one week.  Place one drop(Maxitrol Ophthalmic Eyedrops) in LEFT eye two times a day for one week.  Place tobradex ointment(given to patient after surgery) over bilateral upper eyelid stitched at nighttime for 1 week. IF you have an eye patch. Remove it in 24hrs.

## 2018-04-16 NOTE — Progress Notes (Signed)
At 8:15AM  BP 195/87 HR 70  Dr. Jillyn Hidden aware, no orders given.

## 2018-04-16 NOTE — Brief Op Note (Signed)
04/16/2018  7:48 AM  PATIENT:  Allison Hunt  67 y.o. female  PRE-OPERATIVE DIAGNOSIS:  MECHANICAL  PTOSIS OF BILATERAL EYELIDS, MYOGENIC PTOSIS OF LEFT EYELIDS  POST-OPERATIVE DIAGNOSIS:  * No post-op diagnosis entered *  PROCEDURE:  Procedure(s): BLEPHAROPLASTY BOTH UPPER LIDS (Bilateral) PTOSIS REPAIR LEFT EYE (Left)  Excision only of lesion right lower eyelid  SURGEON:  Surgeon(s) and Role:    * , Peyton Najjar, MD - Primary  PHYSICIAN ASSISTANT: none  ASSISTANTS: none   ANESTHESIA:   local and general  EBL:  minimal   BLOOD ADMINISTERED:none  DRAINS: none   LOCAL MEDICATIONS USED:  BUPIVICAINE  and LIDOCAINE   SPECIMEN:  No Specimen  DISPOSITION OF SPECIMEN:  N/A  COUNTS:  YES  TOURNIQUET:  * No tourniquets in log *  DICTATION: .Note written in EPIC  PLAN OF CARE: Discharge to home after PACU  PATIENT DISPOSITION:  PACU - hemodynamically stable.   Delay start of Pharmacological VTE agent (>24hrs) due to surgical blood loss or risk of bleeding: not applicable

## 2018-04-16 NOTE — Transfer of Care (Signed)
Immediate Anesthesia Transfer of Care Note  Patient: Allison Hunt  Procedure(s) Performed: BLEPHAROPLASTY BOTH UPPER LIDS (Bilateral Eye) PTOSIS REPAIR LEFT EYE (Left Eye)  Patient Location: PACU  Anesthesia Type:General  Level of Consciousness: awake, alert  and oriented  Airway & Oxygen Therapy: Patient Spontanous Breathing and Patient connected to nasal cannula oxygen  Post-op Assessment: Report given to RN, Post -op Vital signs reviewed and stable and Patient moving all extremities  Post vital signs: Reviewed and stable  Last Vitals:  Vitals Value Taken Time  BP 148/66 04/16/2018 12:28 PM  Temp 36.3 C 04/16/2018 12:28 PM  Pulse 93 04/16/2018 12:31 PM  Resp 14 04/16/2018 12:31 PM  SpO2 96 % 04/16/2018 12:31 PM  Vitals shown include unvalidated device data.  Last Pain:  Vitals:   04/16/18 1228  TempSrc:   PainSc: (P) Asleep         Complications: No apparent anesthesia complications

## 2018-04-16 NOTE — Interval H&P Note (Signed)
History and Physical Interval Note:  04/16/2018 7:47 AM  Allison Hunt  has presented today for surgery, with the diagnosis of MECHANICAL  PTOSIS OF BILATERAL EYELIDS, MYOGENIC PTOSIS OF LEFT EYELIDS  The various methods of treatment have been discussed with the patient and family. After consideration of risks, benefits and other options for treatment, the patient has consented to  Procedure(s): BLEPHAROPLASTY BOTH UPPER LIDS (Bilateral) PTOSIS REPAIR LEFT EYE (Left) as a surgical intervention .  The patient's history has been reviewed, patient examined, no change in status, stable for surgery.  I have reviewed the patient's chart and labs.  Questions were answered to the patient's satisfaction.     [name]

## 2018-04-17 ENCOUNTER — Encounter (HOSPITAL_COMMUNITY): Payer: Self-pay | Admitting: Oculoplastics Ophthalmology

## 2018-04-20 NOTE — Anesthesia Postprocedure Evaluation (Signed)
Anesthesia Post Note  Patient: Allison Hunt  Procedure(s) Performed: BLEPHAROPLASTY BOTH UPPER LIDS (Bilateral Eye) PTOSIS REPAIR LEFT EYE (Left Eye)     Patient location during evaluation: PACU Anesthesia Type: General Level of consciousness: awake Pain management: pain level controlled Vital Signs Assessment: post-procedure vital signs reviewed and stable Respiratory status: spontaneous breathing Cardiovascular status: blood pressure returned to baseline Postop Assessment: no apparent nausea or vomiting Anesthetic complications: no    Last Vitals:  Vitals:   04/16/18 1315 04/16/18 1400  BP: (!) 150/82 (!) 148/72  Pulse: 87 87  Resp: 15   Temp: 36.5 C   SpO2: 97% 97%    Last Pain:  Vitals:   04/16/18 1400  TempSrc:   PainSc: 0-No pain   Pain Goal:                 Huston Foley

## 2018-08-26 DIAGNOSIS — H6123 Impacted cerumen, bilateral: Secondary | ICD-10-CM | POA: Diagnosis not present

## 2018-12-14 DIAGNOSIS — M1712 Unilateral primary osteoarthritis, left knee: Secondary | ICD-10-CM | POA: Diagnosis not present

## 2018-12-14 DIAGNOSIS — M1711 Unilateral primary osteoarthritis, right knee: Secondary | ICD-10-CM | POA: Diagnosis not present

## 2018-12-14 DIAGNOSIS — M545 Low back pain: Secondary | ICD-10-CM | POA: Diagnosis not present

## 2018-12-18 DIAGNOSIS — M47816 Spondylosis without myelopathy or radiculopathy, lumbar region: Secondary | ICD-10-CM | POA: Diagnosis not present

## 2018-12-22 DIAGNOSIS — M47816 Spondylosis without myelopathy or radiculopathy, lumbar region: Secondary | ICD-10-CM | POA: Diagnosis not present

## 2018-12-28 DIAGNOSIS — M47816 Spondylosis without myelopathy or radiculopathy, lumbar region: Secondary | ICD-10-CM | POA: Diagnosis not present

## 2018-12-30 DIAGNOSIS — M47816 Spondylosis without myelopathy or radiculopathy, lumbar region: Secondary | ICD-10-CM | POA: Diagnosis not present

## 2019-01-01 DIAGNOSIS — M47816 Spondylosis without myelopathy or radiculopathy, lumbar region: Secondary | ICD-10-CM | POA: Diagnosis not present

## 2019-01-05 DIAGNOSIS — M47816 Spondylosis without myelopathy or radiculopathy, lumbar region: Secondary | ICD-10-CM | POA: Diagnosis not present

## 2019-01-07 DIAGNOSIS — Z1231 Encounter for screening mammogram for malignant neoplasm of breast: Secondary | ICD-10-CM | POA: Diagnosis not present

## 2019-01-12 DIAGNOSIS — M47816 Spondylosis without myelopathy or radiculopathy, lumbar region: Secondary | ICD-10-CM | POA: Diagnosis not present

## 2019-01-14 DIAGNOSIS — M47816 Spondylosis without myelopathy or radiculopathy, lumbar region: Secondary | ICD-10-CM | POA: Diagnosis not present

## 2019-01-19 DIAGNOSIS — M47816 Spondylosis without myelopathy or radiculopathy, lumbar region: Secondary | ICD-10-CM | POA: Diagnosis not present

## 2019-01-22 DIAGNOSIS — M1990 Unspecified osteoarthritis, unspecified site: Secondary | ICD-10-CM | POA: Diagnosis not present

## 2019-02-26 DIAGNOSIS — M1711 Unilateral primary osteoarthritis, right knee: Secondary | ICD-10-CM | POA: Diagnosis not present

## 2019-03-10 DIAGNOSIS — M1711 Unilateral primary osteoarthritis, right knee: Secondary | ICD-10-CM | POA: Diagnosis not present

## 2019-03-17 DIAGNOSIS — M1711 Unilateral primary osteoarthritis, right knee: Secondary | ICD-10-CM | POA: Diagnosis not present

## 2019-03-24 DIAGNOSIS — M1711 Unilateral primary osteoarthritis, right knee: Secondary | ICD-10-CM | POA: Diagnosis not present

## 2019-04-03 ENCOUNTER — Encounter (INDEPENDENT_AMBULATORY_CARE_PROVIDER_SITE_OTHER): Payer: Self-pay

## 2019-04-28 DIAGNOSIS — M8580 Other specified disorders of bone density and structure, unspecified site: Secondary | ICD-10-CM | POA: Diagnosis not present

## 2019-04-28 DIAGNOSIS — K824 Cholesterolosis of gallbladder: Secondary | ICD-10-CM | POA: Diagnosis not present

## 2019-04-28 DIAGNOSIS — R7301 Impaired fasting glucose: Secondary | ICD-10-CM | POA: Diagnosis not present

## 2019-04-28 DIAGNOSIS — K7689 Other specified diseases of liver: Secondary | ICD-10-CM | POA: Diagnosis not present

## 2019-04-28 DIAGNOSIS — E782 Mixed hyperlipidemia: Secondary | ICD-10-CM | POA: Diagnosis not present

## 2019-04-28 DIAGNOSIS — E559 Vitamin D deficiency, unspecified: Secondary | ICD-10-CM | POA: Diagnosis not present

## 2019-05-04 DIAGNOSIS — M4124 Other idiopathic scoliosis, thoracic region: Secondary | ICD-10-CM | POA: Diagnosis not present

## 2019-05-04 DIAGNOSIS — Z0000 Encounter for general adult medical examination without abnormal findings: Secondary | ICD-10-CM | POA: Diagnosis not present

## 2019-05-04 DIAGNOSIS — M8580 Other specified disorders of bone density and structure, unspecified site: Secondary | ICD-10-CM | POA: Diagnosis not present

## 2019-05-04 DIAGNOSIS — Z87442 Personal history of urinary calculi: Secondary | ICD-10-CM | POA: Diagnosis not present

## 2019-05-04 DIAGNOSIS — E559 Vitamin D deficiency, unspecified: Secondary | ICD-10-CM | POA: Diagnosis not present

## 2019-05-04 DIAGNOSIS — R7301 Impaired fasting glucose: Secondary | ICD-10-CM | POA: Diagnosis not present

## 2019-05-04 DIAGNOSIS — K824 Cholesterolosis of gallbladder: Secondary | ICD-10-CM | POA: Diagnosis not present

## 2019-05-04 DIAGNOSIS — M170 Bilateral primary osteoarthritis of knee: Secondary | ICD-10-CM | POA: Diagnosis not present

## 2019-05-04 DIAGNOSIS — K5730 Diverticulosis of large intestine without perforation or abscess without bleeding: Secondary | ICD-10-CM | POA: Diagnosis not present

## 2019-05-04 DIAGNOSIS — E782 Mixed hyperlipidemia: Secondary | ICD-10-CM | POA: Diagnosis not present

## 2019-05-04 DIAGNOSIS — F5101 Primary insomnia: Secondary | ICD-10-CM | POA: Diagnosis not present

## 2019-05-04 DIAGNOSIS — K7689 Other specified diseases of liver: Secondary | ICD-10-CM | POA: Diagnosis not present

## 2019-05-05 ENCOUNTER — Other Ambulatory Visit: Payer: Self-pay

## 2019-05-05 ENCOUNTER — Ambulatory Visit (INDEPENDENT_AMBULATORY_CARE_PROVIDER_SITE_OTHER): Payer: PPO | Admitting: Otolaryngology

## 2019-05-05 ENCOUNTER — Encounter (INDEPENDENT_AMBULATORY_CARE_PROVIDER_SITE_OTHER): Payer: Self-pay | Admitting: Otolaryngology

## 2019-05-05 DIAGNOSIS — H6123 Impacted cerumen, bilateral: Secondary | ICD-10-CM | POA: Diagnosis not present

## 2019-05-05 DIAGNOSIS — H2513 Age-related nuclear cataract, bilateral: Secondary | ICD-10-CM | POA: Diagnosis not present

## 2019-05-05 DIAGNOSIS — H43811 Vitreous degeneration, right eye: Secondary | ICD-10-CM | POA: Diagnosis not present

## 2019-05-05 NOTE — Progress Notes (Signed)
HPI: Allison Hunt is a 68 y.o. female who presents for evaluation of cerumen removal. Patient reports blocked feeling in both ears. She usually needs to get her ears cleaned every 6 months. No further complaints today.  Past Medical History:  Diagnosis Date  . Anxiety   . Arthritis    osteoarthritis both knees  . Cardiac asystole ([place])    a.  Felt to be 2/2 high vagal tone in setting of knee surgery;  b. 09/2011 Echo: EF 60-65%.  . Cervical cancer ([place]) 1984  . 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 right knee - s/p Knee surgery 09/2011  . Complication of anesthesia    a. Vasovagal syncope with bradycardia and asystole requiring brief chest compression 02/07/12 & hypotension  . Dental crowns present   . Fibromyalgia   . Gallbladder polyp   . 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 History of kidney stones   . Hypercholesterolemia   . Insomnia   . Lateral meniscal tear 09/2011   a. right knee  . Liver hemangioma   . Mechanical ptosis of bilateral eyelids    upper eyelids  . Medial meniscus tear 09/2011   a. right knee  . Mitral valve prolapse   . Myogenic ptosis    left upper eyelid  . Paroxysmal atrial fibrillation ([place])    a. 09/2011 in setting of post-op knee  . PONV (postoperative nausea and vomiting)    flat line after prophol, hx of bp droppin also  . Positional vertigo   . Shortness of breath    with exertion  resolved  . Syncope, vasovagal 2005   a. exacerbated by pain/surgical procedures   Past Surgical History:  Procedure Laterality Date  . BROW LIFT Bilateral 04/16/2018   Procedure: BLEPHAROPLASTY BOTH UPPER LIDS;  Surgeon: [name], MD;  Location: [facility];  Service: Ophthalmology;  Laterality: Bilateral;  . COLONOSCOPY WITH PROPOFOL N/A 02/19/2017   Procedure: COLONOSCOPY WITH PROPOFOL;  Surgeon: Arta Silence, MD;  Location: [facility];  Service: Endoscopy;  Laterality: N/A;  .  DILATION AND CURETTAGE OF UTERUS    . KIDNEY STONE SURGERY     denies  . KNEE ARTHROSCOPY  10/08/2011   Procedure: ARTHROSCOPY KNEE;  Surgeon: [name], MD;  Location: [facility];  Service: Orthopedics;  Laterality: Right;  right knee arthroscopy partial medial and lateral menisectomy and chondroplasty  . KNEE ARTHROSCOPY Left 01/04/2014   Procedure: ARTHROSCOPY KNEE;  Surgeon: [name], MD;  Location: [facility];  Service: Orthopedics;  Laterality: Left;  Patient flatlined at outpatient surgery center when she had same surgery a year ago ended up in ICU for two days has had cardiac issues since 2013.  Marland Kitchen PATENT DUCTUS ARTERIOUS REPAIR  1956  . PTOSIS REPAIR Left 04/16/2018   Procedure: PTOSIS REPAIR LEFT EYE;  Surgeon: [name], MD;  Location: [facility];  Service: Ophthalmology;  Laterality: Left;  . TOTAL ABDOMINAL HYSTERECTOMY  1991   due to cervical cancer  . TUBAL LIGATION     Social History   Socioeconomic History  . Marital status: Single    Spouse name: Not on file  . Number of children: 1  . Years of education: Not on file  . Highest education level: Not on file  Occupational History  . Occupation: Product manager: [employer]  . Financial  resource strain: Not on file  . Food insecurity    Worry: Not on file    Inability: Not on file  . Transportation needs    Medical: Not on file    Non-medical: Not on file  Tobacco Use  . Smoking status: Former Research scientist (life sciences)  . Smokeless tobacco: Never Used  . Tobacco comment: quit smoking cigarettes in 1991  Substance and Sexual Activity  . Alcohol use: Yes    Comment: 1 daily glass of wine  . Drug use: No  . Sexual activity: Not on file  Lifestyle  . Physical activity    Days per week: Not on file    Minutes per session: Not on file  . Stress: Not on file  Relationships  . Social Herbalist on phone: Not on file    Gets together: Not on file    Attends  religious service: Not on file    Active member of club or organization: Not on file    Attends meetings of clubs or organizations: Not on file    Relationship status: Not on file  Other Topics Concern  . Not on file  Social History Narrative       Family History  Problem Relation Age of Onset  . Anemia Father   . Heart attack Father   . Pulmonary embolism Father   . Colon cancer Father   . Alzheimer's disease Mother   . Lupus Sister   . Aortic stenosis Son        with AVR at 75 y/o  . Suicidality Paternal Grandfather   . Dementia Paternal Grandmother   . Colon cancer Maternal Grandmother    Allergies  Allergen Reactions  . Propofol Anaphylaxis and Other (See Comments)    Pt "flat lined" twice when she had propofol administered to her during another surgery  . Demerol Nausea And Vomiting  . Tramadol Nausea And Vomiting and Other (See Comments)    Pt must take with food to tolerate medication   Prior to Admission medications   Medication Sig Start Date End Date Taking? Authorizing Provider  atorvastatin (LIPITOR) 10 MG tablet Take 10 mg by mouth daily.  12/03/13   [provider]  Calcium Carb-Cholecalciferol (CALCIUM 600+D3) 600-800 MG-UNIT TABS Take 2 tablets by mouth at bedtime.    [provider]  diclofenac (VOLTAREN) 75 MG EC tablet Take 75 mg by mouth daily. May take a second 75 mg dose as needed for pain    [provider]  erythromycin ophthalmic ointment Place 1 application into the left eye at bedtime. For 7 days 03/31/18   [provider]  neomycin-polymyxin b-dexamethasone (MAXITROL) 3.5-10000-0.1 SUSP Place 1 drop into the left eye 2 (two) times daily.  03/31/18   [provider]  PREVIDENT 0.2 % SOLN Take 15 mLs by mouth at bedtime. Sensitive teeth 11/17/13   [provider]  raloxifene (EVISTA) 60 MG tablet Take 60 mg by mouth daily. 01/30/18   [provider]  zaleplon (SONATA) 10 MG capsule Take 10 mg  by mouth at bedtime as needed for sleep.     [provider]     Positive ROS: otherwise negative  All other systems have been reviewed and were otherwise negative with the exception of those mentioned in the HPI and as above.  Physical Exam: Constitutional: Alert, well-appearing, no acute distress Ears: External ears without lesions or tenderness. Ear canals are very small bilaterally; L>R. Both ear canals were  completely obstructed with wax. After removal, EACs clear with clear, intact TMs. Nasal: External nose without lesions. Clear nasal passages Neck: No palpable adenopathy or masses Respiratory: Breathing comfortably  Skin: No facial/neck lesions or rash noted.  Cerumen impaction removal  Date/Time: 05/05/2019 12:21 PM Performed by: Rozetta Nunnery, MD Authorized by: Rozetta Nunnery, MD   Consent:    Consent obtained:  Verbal   Consent given by:  Patient   Alternatives discussed:  No treatment Procedure details:    Location:  R ear and L ear   Procedure type: curette and forceps   Post-procedure details:    Inspection:  TM intact and canal normal   Hearing quality:  Improved   Patient tolerance of procedure:  Tolerated well, no immediate complications Comments:     Of note, patient does not like suction    Assessment: Cerumen impaction, bilateral  Plan: Cerumen removed with improvement in symptoms. Patient will follow up PRN.   Christin Hoffstadt, PA-C  I have personally seen and examined this patient. I agree with the assessment and plan as outlined above. Radene Journey, MD

## 2019-05-19 DIAGNOSIS — M1712 Unilateral primary osteoarthritis, left knee: Secondary | ICD-10-CM | POA: Diagnosis not present

## 2019-06-25 DIAGNOSIS — M1712 Unilateral primary osteoarthritis, left knee: Secondary | ICD-10-CM | POA: Diagnosis not present

## 2019-07-02 DIAGNOSIS — M1712 Unilateral primary osteoarthritis, left knee: Secondary | ICD-10-CM | POA: Diagnosis not present

## 2019-07-09 DIAGNOSIS — M1712 Unilateral primary osteoarthritis, left knee: Secondary | ICD-10-CM | POA: Diagnosis not present

## 2019-07-09 DIAGNOSIS — M1711 Unilateral primary osteoarthritis, right knee: Secondary | ICD-10-CM | POA: Diagnosis not present

## 2019-08-17 DIAGNOSIS — N060 Isolated proteinuria with minor glomerular abnormality: Secondary | ICD-10-CM | POA: Diagnosis not present

## 2019-09-03 DIAGNOSIS — R1011 Right upper quadrant pain: Secondary | ICD-10-CM | POA: Diagnosis not present

## 2019-09-03 DIAGNOSIS — K824 Cholesterolosis of gallbladder: Secondary | ICD-10-CM | POA: Diagnosis not present

## 2019-09-08 DIAGNOSIS — M1712 Unilateral primary osteoarthritis, left knee: Secondary | ICD-10-CM | POA: Diagnosis not present

## 2019-09-08 DIAGNOSIS — M1711 Unilateral primary osteoarthritis, right knee: Secondary | ICD-10-CM | POA: Diagnosis not present

## 2019-09-27 DIAGNOSIS — R7301 Impaired fasting glucose: Secondary | ICD-10-CM | POA: Diagnosis not present

## 2019-09-27 DIAGNOSIS — Z01812 Encounter for preprocedural laboratory examination: Secondary | ICD-10-CM | POA: Diagnosis not present

## 2019-10-04 ENCOUNTER — Other Ambulatory Visit: Payer: Self-pay | Admitting: Orthopaedic Surgery

## 2019-10-04 DIAGNOSIS — M1711 Unilateral primary osteoarthritis, right knee: Secondary | ICD-10-CM | POA: Diagnosis not present

## 2019-10-04 DIAGNOSIS — M1712 Unilateral primary osteoarthritis, left knee: Secondary | ICD-10-CM | POA: Diagnosis not present

## 2019-10-04 NOTE — Progress Notes (Signed)
Need orders in epic.  Preop appt on 10/05/19 at 0800am.  Thank You.

## 2019-10-05 ENCOUNTER — Encounter (HOSPITAL_COMMUNITY): Payer: Self-pay

## 2019-10-05 ENCOUNTER — Other Ambulatory Visit: Payer: Self-pay

## 2019-10-05 ENCOUNTER — Encounter (HOSPITAL_COMMUNITY)
Admission: RE | Admit: 2019-10-05 | Discharge: 2019-10-05 | Disposition: A | Discharge status: Home / Self Care | Payer: PPO | Source: Ambulatory Visit | Attending: Orthopaedic Surgery | Admitting: Orthopaedic Surgery

## 2019-10-05 HISTORY — DX: Gastro-esophageal reflux disease without esophagitis: K21.9

## 2019-10-05 NOTE — Progress Notes (Signed)
Anesthesia Review:  PCP:DR Orlinda Blalock - [facility] appt on 10/08/19 for clearance for surgery per patient  Cardiologist : See above MD - patient states she is seeing for clearance on 4/30 Chest x-ray : EKG : Echo : Cardiac Cath :  Sleep Study/ CPAP : Fasting Blood Sugar :      / Checks Blood Sugar -- times a day:   Blood Thinner/ Instructions /Last Dose: ASA / Instructions/ Last Dose :   Patient denies shortness of breath, chest pain, fever, and cough at this phone interview.

## 2019-10-05 NOTE — Progress Notes (Signed)
DUE TO COVID-19 ONLY ONE VISITOR IS ALLOWED TO COME WITH YOU AND STAY IN THE WAITING ROOM ONLY DURING PRE OP AND PROCEDURE DAY OF SURGERY. THE 1 VISITOR MAY VISIT WITH YOU AFTER SURGERY IN YOUR PRIVATE ROOM DURING VISITING HOURS ONLY!  YOU NEED TO HAVE A COVID 19 TEST ON_______ @_______ , THIS TEST MUST BE DONE BEFORE SURGERY, COME  [address], [address] [place] , [id number].  ([place]) ONCE YOUR COVID TEST IS COMPLETED, PLEASE BEGIN THE QUARANTINE INSTRUCTIONS AS OUTLINED IN YOUR HANDOUT.                Allisia Busbey  10/05/2019   Your procedure is scheduled on:  10/14/19   Report to [facility] Main  Entrance   Report to admitting at   [place] AM     Call this number if you have problems the morning of surgery [phone]    Remember: Do not eat food or drink liquids :After Midnight. BRUSH YOUR TEETH MORNING OF SURGERY AND RINSE YOUR MOUTH OUT, NO CHEWING GUM CANDY OR MINTS.     Take these medicines the morning of surgery with A SIP OF WATER: none   DO NOT TAKE ANY DIABETIC MEDICATIONS DAY OF YOUR SURGERY                               You may not have any metal on your body including hair pins and              piercings  Do not wear jewelry, make-up, lotions, powders or perfumes, deodorant             Do not wear nail polish on your fingernails.  Do not shave  48 hours prior to surgery.              Men may shave face and neck.   Do not bring valuables to the hospital. [place].  Contacts, dentures or bridgework may not be worn into surgery.  Leave suitcase in the car. After surgery it may be brought to your room.     Patients discharged the day of surgery will not be allowed to drive home. IF YOU ARE HAVING SURGERY AND GOING HOME THE SAME DAY, YOU MUST HAVE AN ADULT TO DRIVE YOU HOME AND BE WITH YOU FOR 24 HOURS. YOU MAY GO HOME BY TAXI OR UBER OR ORTHERWISE, BUT AN ADULT MUST ACCOMPANY YOU HOME AND STAY  WITH YOU FOR 24 HOURS.  Name and phone number of your driver:  Special Instructions: N/A              Please read over the following fact sheets you were given: _____________________________________________________________________             [facility] - Preparing for Surgery Before surgery, you can play an important role.  Because skin is not sterile, your skin needs to be as free of germs as possible.  You can reduce the number of germs on your skin by washing with CHG (chlorahexidine gluconate) soap before surgery.  CHG is an antiseptic cleaner which kills germs and bonds with the skin to continue killing germs even after washing. Please DO NOT use if you have an allergy to CHG or antibacterial soaps.  If your skin becomes reddened/irritated stop using the  CHG and inform your nurse when you arrive at Short Stay. Do not shave (including legs and underarms) for at least 48 hours prior to the first CHG shower.  You may shave your face/neck. Please follow these instructions carefully:  1.  Shower with CHG Soap the night before surgery and the  morning of Surgery.  2.  If you choose to wash your hair, wash your hair first as usual with your  normal  shampoo.  3.  After you shampoo, rinse your hair and body thoroughly to remove the  shampoo.                           4.  Use CHG as you would any other liquid soap.  You can apply chg directly  to the skin and wash                       Gently with a scrungie or clean washcloth.  5.  Apply the CHG Soap to your body ONLY FROM THE NECK DOWN.   Do not use on face/ open                           Wound or open sores. Avoid contact with eyes, ears mouth and genitals (private parts).                       Wash face,  Genitals (private parts) with your normal soap.             6.  Wash thoroughly, paying special attention to the area where your surgery  will be performed.  7.  Thoroughly rinse your body with warm water from the neck down.  8.  DO NOT  shower/wash with your normal soap after using and rinsing off  the CHG Soap.                9.  Pat yourself dry with a clean towel.            10.  Wear clean pajamas.            11.  Place clean sheets on your bed the night of your first shower and do not  sleep with pets. Day of Surgery : Do not apply any lotions/deodorants the morning of surgery.  Please wear clean clothes to the hospital/surgery center.  FAILURE TO FOLLOW THESE INSTRUCTIONS MAY RESULT IN THE CANCELLATION OF YOUR SURGERY PATIENT SIGNATURE_________________________________  NURSE SIGNATURE__________________________________  ________________________________________________________________________ [facility] - Preparing for Surgery Before surgery, you can play an important role.  Because skin is not sterile, your skin needs to be as free of germs as possible.  You can reduce the number of germs on your skin by washing with CHG (chlorahexidine gluconate) soap before surgery.  CHG is an antiseptic cleaner which kills germs and bonds with the skin to continue killing germs even after washing. Please DO NOT use if you have an allergy to CHG or antibacterial soaps.  If your skin becomes reddened/irritated stop using the CHG and inform your nurse when you arrive at Short Stay. Do not shave (including legs and underarms) for at least 48 hours prior to the first CHG shower.  You may shave your face/neck. Please follow these instructions carefully:  1.  Shower with CHG Soap the night before surgery and the  morning of Surgery.  2.  If you choose to wash your hair, wash your hair first as usual with your  normal  shampoo.  3.  After you shampoo, rinse your hair and body thoroughly to remove the  shampoo.                           4.  Use CHG as you would any other liquid soap.  You can apply chg directly  to the skin and wash                       Gently with a scrungie or clean washcloth.  5.  Apply the CHG Soap to your body ONLY FROM  THE NECK DOWN.   Do not use on face/ open                           Wound or open sores. Avoid contact with eyes, ears mouth and genitals (private parts).                       Wash face,  Genitals (private parts) with your normal soap.             6.  Wash thoroughly, paying special attention to the area where your surgery  will be performed.  7.  Thoroughly rinse your body with warm water from the neck down.  8.  DO NOT shower/wash with your normal soap after using and rinsing off  the CHG Soap.                9.  Pat yourself dry with a clean towel.            10.  Wear clean pajamas.            11.  Place clean sheets on your bed the night of your first shower and do not  sleep with pets. Day of Surgery : Do not apply any lotions/deodorants the morning of surgery.  Please wear clean clothes to the hospital/surgery center.  FAILURE TO FOLLOW THESE INSTRUCTIONS MAY RESULT IN THE CANCELLATION OF YOUR SURGERY PATIENT SIGNATURE_________________________________  NURSE SIGNATURE__________________________________  ________________________________________________________________________

## 2019-10-06 ENCOUNTER — Ambulatory Visit (INDEPENDENT_AMBULATORY_CARE_PROVIDER_SITE_OTHER): Payer: PPO | Admitting: Otolaryngology

## 2019-10-06 VITALS — Temp 97.5°F

## 2019-10-06 DIAGNOSIS — H6123 Impacted cerumen, bilateral: Secondary | ICD-10-CM | POA: Diagnosis not present

## 2019-10-06 DIAGNOSIS — Z01812 Encounter for preprocedural laboratory examination: Secondary | ICD-10-CM | POA: Diagnosis not present

## 2019-10-06 DIAGNOSIS — M170 Bilateral primary osteoarthritis of knee: Secondary | ICD-10-CM | POA: Diagnosis not present

## 2019-10-06 NOTE — Progress Notes (Signed)
HPI: Allison Hunt is a 69 y.o. 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  She also inquires about reflux problems which is presently controlled with Tums.  She is able to swallow okay and has gained weight since Covid.  Food does not get stuck permanently but sometimes she has to drink water to get chicken down..  Past Medical History:  Diagnosis Date  . Arthritis    osteoarthritis both knees  . Cardiac asystole ([place])    a.  Felt to be 2/2 high vagal tone in setting of knee surgery;  b. 09/2011 Echo: EF 60-65%.  . Cervical cancer ([place]) 1984  . 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 right knee - s/p Knee surgery 09/2011  . Complication of anesthesia    a. Vasovagal syncope with bradycardia and asystole requiring brief chest compression 02/07/12 & hypotension  . Dental crowns present   . Gallbladder polyp   . GERD (gastroesophageal reflux disease)   . 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 History of kidney stones   . Hypercholesterolemia   . Insomnia   . Lateral meniscal tear 09/2011   a. right knee  . Liver hemangioma   . Mechanical ptosis of bilateral eyelids    upper eyelids  . Medial meniscus tear 09/2011   a. right knee  . Mitral valve prolapse   . Myogenic ptosis    left upper eyelid  . Paroxysmal atrial fibrillation ([place])    a. 09/2011 in setting of post-op knee  . PONV (postoperative nausea and vomiting)    flat line after prophol, hx of bp droppin also  . Positional vertigo   . Shortness of breath   . Syncope, vasovagal 2005   a. exacerbated by pain/surgical procedures   Past Surgical History:  Procedure Laterality Date  . BROW LIFT Bilateral 04/16/2018   Procedure: BLEPHAROPLASTY BOTH UPPER LIDS;  Surgeon: [name], MD;  Location: [place];  Service: Ophthalmology;  Laterality: Bilateral;  . COLONOSCOPY WITH PROPOFOL N/A 02/19/2017    Procedure: COLONOSCOPY WITH PROPOFOL;  Surgeon: Arta Silence, MD;  Location: [facility];  Service: Endoscopy;  Laterality: N/A;  . DILATION AND CURETTAGE OF UTERUS    . KIDNEY STONE SURGERY     denies  . KNEE ARTHROSCOPY  10/08/2011   Procedure: ARTHROSCOPY KNEE;  Surgeon: [name], MD;  Location: [facility];  Service: Orthopedics;  Laterality: Right;  right knee arthroscopy partial medial and lateral menisectomy and chondroplasty  . KNEE ARTHROSCOPY Left 01/04/2014   Procedure: ARTHROSCOPY KNEE;  Surgeon: [name], MD;  Location: [facility];  Service: Orthopedics;  Laterality: Left;  Patient flatlined at outpatient surgery center when she had same surgery a year ago ended up in ICU for two days has had cardiac issues since 2013.  Marland Kitchen PATENT DUCTUS ARTERIOUS REPAIR  1956  . PTOSIS REPAIR Left 04/16/2018   Procedure: PTOSIS REPAIR LEFT EYE;  Surgeon: [name], MD;  Location: [place];  Service: Ophthalmology;  Laterality: Left;  . TOTAL ABDOMINAL HYSTERECTOMY  1991   due to cervical cancer  . TUBAL LIGATION     Social History   Socioeconomic History  . Marital status: Single    Spouse name: Not on file  . Number of children: 1  . Years of education: 30  . Highest education  level: Not on file  Occupational History  . Occupation: Product manager: [employer]  Tobacco Use  . Smoking status: Former Research scientist (life sciences)  . Smokeless tobacco: Never Used  . Tobacco comment: quit smoking cigarettes in 1991  Substance and Sexual Activity  . Alcohol use: Yes    Comment: 1 daily glass of wine  . Drug use: No  . Sexual activity: Not on file  Other Topics Concern  . Not on file  Social History Narrative       Social Determinants of Health   Financial Resource Strain:   . Difficulty of Paying Living Expenses:   Food Insecurity:   . Worried About Charity fundraiser in the Last Year:   . Arboriculturist in the Last Year:   Transportation  Needs:   . Film/video editor (Medical):   Marland Kitchen Lack of Transportation (Non-Medical):   Physical Activity:   . Days of Exercise per Week:   . Minutes of Exercise per Session:   Stress:   . Feeling of Stress :   Social Connections:   . Frequency of Communication with Friends and Family:   . Frequency of Social Gatherings with Friends and Family:   . Attends Religious Services:   . Active Member of Clubs or Organizations:   . Attends Archivist Meetings:   Marland Kitchen Marital Status:    Family History  Problem Relation Age of Onset  . Anemia Father   . Heart attack Father   . Pulmonary embolism Father   . Colon cancer Father   . Alzheimer's disease Mother   . Lupus Sister   . Aortic stenosis Son        with AVR at 73 y/o  . Suicidality Paternal Grandfather   . Dementia Paternal Grandmother   . Colon cancer Maternal Grandmother    Allergies  Allergen Reactions  . Propofol Anaphylaxis and Other (See Comments)    Pt "flat lined" twice when she had propofol administered to her during another surgery  . Demerol Nausea And Vomiting  . Tramadol Nausea And Vomiting and Other (See Comments)    Pt must take with food to tolerate medication   Prior to Admission medications   Medication Sig Start Date End Date Taking? Authorizing Provider  acetaminophen (TYLENOL) 500 MG tablet Take 500 mg by mouth every 6 (six) hours as needed for moderate pain.    [provider]  atorvastatin (LIPITOR) 10 MG tablet Take 10 mg by mouth daily.  12/03/13   [provider]  Cholecalciferol (VITAMIN D) 50 MCG (2000 UT) CAPS Take 2,000 Units by mouth daily.    [provider]  PREVIDENT 0.2 % SOLN Take 15 mLs by mouth at bedtime.  11/17/13   [provider]  raloxifene (EVISTA) 60 MG tablet Take 60 mg by mouth daily. 01/30/18   [provider]  zaleplon (SONATA) 10 MG capsule Take 10 mg by mouth at bedtime.     [provider]     Positive ROS:  Otherwise negative  All other systems have been reviewed and were otherwise negative with the exception of those mentioned in the HPI and as above.  Physical Exam: Constitutional: Alert, well-appearing, no acute distress Ears: External ears without lesions or tenderness. Ear canals left ear canal is very narrowed compared to the right.  She had a large amount of wax in the left ear and a small amount of wax in the right ear  both were cleaned with curettes.  TMs were otherwise clear.. Nasal: External nose without lesions. Clear nasal passages Oral: Oropharynx clear. Neck: No palpable adenopathy or masses Respiratory: Breathing comfortably  Skin: No facial/neck lesions or rash noted.  Cerumen impaction removal  Date/Time: 10/06/2019 10:45 AM Performed by: Rozetta Nunnery, MD Authorized by: Rozetta Nunnery, MD   Consent:    Consent obtained:  Verbal   Consent given by:  Patient   Risks discussed:  Pain and bleeding Procedure details:    Location:  L ear and R ear   Procedure type: curette   Post-procedure details:    Inspection:  TM intact and canal normal   Hearing quality:  Improved   Patient tolerance of procedure:  Tolerated well, no immediate complications Comments:     TMs are clear bilaterally    Assessment: Cerumen buildup left side worse than right with narrowed left ear canal. History of GERD clinically.  Plan: She will follow up as needed. Concerning her reflux symptoms would recommend addressing this with her medical doctor.  Radene Journey, MD

## 2019-10-07 ENCOUNTER — Other Ambulatory Visit: Payer: Self-pay

## 2019-10-07 ENCOUNTER — Ambulatory Visit (HOSPITAL_COMMUNITY)
Admission: RE | Admit: 2019-10-07 | Discharge: 2019-10-07 | Disposition: A | Discharge status: Home / Self Care | Payer: PPO | Source: Ambulatory Visit | Attending: Orthopaedic Surgery | Admitting: Orthopaedic Surgery

## 2019-10-07 ENCOUNTER — Encounter (HOSPITAL_COMMUNITY)
Admission: RE | Admit: 2019-10-07 | Discharge: 2019-10-07 | Disposition: A | Discharge status: Home / Self Care | Payer: PPO | Source: Ambulatory Visit | Attending: Orthopaedic Surgery | Admitting: Orthopaedic Surgery

## 2019-10-07 DIAGNOSIS — Z01818 Encounter for other preprocedural examination: Secondary | ICD-10-CM | POA: Insufficient documentation

## 2019-10-07 LAB — URINALYSIS, ROUTINE W REFLEX MICROSCOPIC
Bilirubin Urine: NEGATIVE
Glucose, UA: NEGATIVE mg/dL
Hgb urine dipstick: NEGATIVE
Ketones, ur: 5 mg/dL — AB
Leukocytes,Ua: NEGATIVE
Nitrite: NEGATIVE
Protein, ur: NEGATIVE mg/dL
Specific Gravity, Urine: 1.033 — ABNORMAL HIGH (ref 1.005–1.030)
pH: 5 (ref 5.0–8.0)

## 2019-10-07 LAB — CBC WITH DIFFERENTIAL/PLATELET
Abs Immature Granulocytes: 0.03 10*3/uL (ref 0.00–0.07)
Basophils Absolute: 0 10*3/uL (ref 0.0–0.1)
Basophils Relative: 0 %
Eosinophils Absolute: 0.1 10*3/uL (ref 0.0–0.5)
Eosinophils Relative: 2 %
HCT: 41.1 % (ref 36.0–46.0)
Hemoglobin: 13.5 g/dL (ref 12.0–15.0)
Immature Granulocytes: 0 %
Lymphocytes Relative: 16 %
Lymphs Abs: 1.2 10*3/uL (ref 0.7–4.0)
MCH: 30.2 pg (ref 26.0–34.0)
MCHC: 32.8 g/dL (ref 30.0–36.0)
MCV: 91.9 fL (ref 80.0–100.0)
Monocytes Absolute: 0.5 10*3/uL (ref 0.1–1.0)
Monocytes Relative: 6 %
Neutro Abs: 5.9 10*3/uL (ref 1.7–7.7)
Neutrophils Relative %: 76 %
Platelets: 358 10*3/uL (ref 150–400)
RBC: 4.47 MIL/uL (ref 3.87–5.11)
RDW: 13.2 % (ref 11.5–15.5)
WBC: 7.8 10*3/uL (ref 4.0–10.5)
nRBC: 0 % (ref 0.0–0.2)

## 2019-10-07 LAB — SURGICAL PCR SCREEN
MRSA, PCR: NEGATIVE
Staphylococcus aureus: NEGATIVE

## 2019-10-07 LAB — ABO/RH: ABO/RH(D): A POS

## 2019-10-07 LAB — PROTIME-INR
INR: 1.1 (ref 0.8–1.2)
Prothrombin Time: 13.5 seconds (ref 11.4–15.2)

## 2019-10-07 LAB — APTT: aPTT: 32 seconds (ref 24–36)

## 2019-10-07 LAB — BASIC METABOLIC PANEL
Anion gap: 8 (ref 5–15)
BUN: 22 mg/dL (ref 8–23)
CO2: 28 mmol/L (ref 22–32)
Calcium: 9 mg/dL (ref 8.9–10.3)
Chloride: 103 mmol/L (ref 98–111)
Creatinine, Ser: 0.55 mg/dL (ref 0.44–1.00)
GFR calc Af Amer: >60 mL/min (ref >60)
GFR calc non Af Amer: >60 mL/min (ref >60)
Glucose, Bld: 96 mg/dL (ref 70–99)
Potassium: 3.9 mmol/L (ref 3.5–5.1)
Sodium: 139 mmol/L (ref 135–145)

## 2019-10-07 NOTE — Progress Notes (Signed)
U/a done 10/07/19 faed via epic to DR [facility].

## 2019-10-07 NOTE — Progress Notes (Signed)
NO SOLID FOOD AFTER MIDNIGHT THE NIGHT PRIOR TO SURGERY. NOTHING BY MOUTH EXCEPT CLEAR LIQUIDS UNTIL  0430am  . PLEASE FINISH ENSURE DRINK PER SURGEON ORDER  WHICH NEEDS TO BE COMPLETED AT .0430am    CLEAR LIQUID DIET   Foods Allowed                                                                     Foods Excluded  Coffee and tea, regular and decaf                             liquids that you cannot  Plain Jell-O any favor except red or purple                                           see through such as: Fruit ices (not with fruit pulp)                                     milk, soups, orange juice  Iced Popsicles                                    All solid food Carbonated beverages, regular and diet                                    Cranberry, grape and apple juices Sports drinks like Gatorade Lightly seasoned clear broth or consume(fat free) Sugar, honey syrup  Sample Menu Breakfast                                Lunch                                     Supper Cranberry juice                    Beef broth                            Chicken broth Jell-O                                     Grape juice                           Apple juice Coffee or tea                        Jell-O  Popsicle                                                Coffee or tea                        Coffee or tea  _____________________________________________________________________    Incentive Spirometer  An incentive spirometer is a tool that can help keep your lungs clear and active. This tool measures how well you are filling your lungs with each breath. Taking long deep breaths may help reverse or decrease the chance of developing breathing (pulmonary) problems (especially infection) following:  A long period of time when you are unable to move or be active. BEFORE THE PROCEDURE   If the spirometer includes an indicator to show your best effort, your nurse or  respiratory therapist will set it to a desired goal.  If possible, sit up straight or lean slightly forward. Try not to slouch.  Hold the incentive spirometer in an upright position. INSTRUCTIONS FOR USE  1. Sit on the edge of your bed if possible, or sit up as far as you can in bed or on a chair. 2. Hold the incentive spirometer in an upright position. 3. Breathe out normally. 4. Place the mouthpiece in your mouth and seal your lips tightly around it. 5. Breathe in slowly and as deeply as possible, raising the piston or the ball toward the top of the column. 6. Hold your breath for 3-5 seconds or for as long as possible. Allow the piston or ball to fall to the bottom of the column. 7. Remove the mouthpiece from your mouth and breathe out normally. 8. Rest for a few seconds and repeat Steps 1 through 7 at least 10 times every 1-2 hours when you are awake. Take your time and take a few normal breaths between deep breaths. 9. The spirometer may include an indicator to show your best effort. Use the indicator as a goal to work toward during each repetition. 10. After each set of 10 deep breaths, practice coughing to be sure your lungs are clear. If you have an incision (the cut made at the time of surgery), support your incision when coughing by placing a pillow or rolled up towels firmly against it. Once you are able to get out of bed, walk around indoors and cough well. You may stop using the incentive spirometer when instructed by your caregiver.  RISKS AND COMPLICATIONS  Take your time so you do not get dizzy or light-headed.  If you are in pain, you may need to take or ask for pain medication before doing incentive spirometry. It is harder to take a deep breath if you are having pain. AFTER USE  Rest and breathe slowly and easily.  It can be helpful to keep track of a log of your progress. Your caregiver can provide you with a simple table to help with this. If you are using the  spirometer at home, follow these instructions: [place] IF:   You are having difficultly using the spirometer.  You have trouble using the spirometer as often as instructed.  Your pain medication is not giving enough relief while using the spirometer.  You develop fever of 100.5 F (38.1 C) or higher. SEEK IMMEDIATE MEDICAL CARE IF:   You cough up bloody  sputum that had not been present before.  You develop fever of 102 F (38.9 C) or greater.  You develop worsening pain at or near the incision site. MAKE SURE YOU:   Understand these instructions.  Will watch your condition.  Will get help right away if you are not doing well or get worse. Document Released: 10/07/2006 Document Revised: 08/19/2011 Document Reviewed: 12/08/2006 ExitCare Patient Information 2014 Exit[employer], [place].   ________________________________________________________________________  WHAT IS A BLOOD TRANSFUSION? Blood Transfusion Information  A transfusion is the replacement of blood or some of its parts. Blood is made up of multiple cells which provide different functions.  Red blood cells carry oxygen and are used for blood loss replacement.  White blood cells fight against infection.  Platelets control bleeding.  Plasma helps clot blood.  Other blood products are available for specialized needs, such as hemophilia or other clotting disorders. BEFORE THE TRANSFUSION  Who gives blood for transfusions?   Healthy volunteers who are fully evaluated to make sure their blood is safe. This is blood bank blood. Transfusion therapy is the safest it has ever been in the practice of medicine. Before blood is taken from a donor, a complete history is taken to make sure that person has no history of diseases nor engages in risky social behavior (examples are intravenous drug use or sexual activity with multiple partners). The donor's travel history is screened to minimize risk of transmitting  infections, such as malaria. The donated blood is tested for signs of infectious diseases, such as HIV and hepatitis. The blood is then tested to be sure it is compatible with you in order to minimize the chance of a transfusion reaction. If you or a relative donates blood, this is often done in anticipation of surgery and is not appropriate for emergency situations. It takes many days to process the donated blood. RISKS AND COMPLICATIONS Although transfusion therapy is very safe and saves many lives, the main dangers of transfusion include:   Getting an infectious disease.  Developing a transfusion reaction. This is an allergic reaction to something in the blood you were given. Every precaution is taken to prevent this. The decision to have a blood transfusion has been considered carefully by your caregiver before blood is given. Blood is not given unless the benefits outweigh the risks. AFTER THE TRANSFUSION  Right after receiving a blood transfusion, you will usually feel much better and more energetic. This is especially true if your red blood cells have gotten low (anemic). The transfusion raises the level of the red blood cells which carry oxygen, and this usually causes an energy increase.  The nurse administering the transfusion will monitor you carefully for complications. HOME CARE INSTRUCTIONS  No special instructions are needed after a transfusion. You may find your energy is better. Speak with your caregiver about any limitations on activity for underlying diseases you may have. SEEK MEDICAL CARE IF:   Your condition is not improving after your transfusion.  You develop redness or irritation at the intravenous (IV) site. SEEK IMMEDIATE MEDICAL CARE IF:  Any of the following symptoms occur over the next 12 hours:  Shaking chills.  You have a temperature by mouth above 102 F (38.9 C), not controlled by medicine.  Chest, back, or muscle pain.  People around you feel you are  not acting correctly or are confused.  Shortness of breath or difficulty breathing.  Dizziness and fainting.  You get a rash or develop hives.  You have a  decrease in urine output.  Your urine turns a dark color or changes to pink, red, or brown. Any of the following symptoms occur over the next 10 days:  You have a temperature by mouth above 102 F (38.9 C), not controlled by medicine.  Shortness of breath.  Weakness after normal activity.  The white part of the eye turns yellow (jaundice).  You have a decrease in the amount of urine or are urinating less often.  Your urine turns a dark color or changes to pink, red, or brown. Document Released: 05/24/2000 Document Revised: 08/19/2011 Document Reviewed: 01/11/2008 [facility] Patient Information 2014 [place], [place].  _______________________________________________________________________

## 2019-10-08 DIAGNOSIS — R001 Bradycardia, unspecified: Secondary | ICD-10-CM | POA: Diagnosis not present

## 2019-10-08 DIAGNOSIS — Z8774 Personal history of (corrected) congenital malformations of heart and circulatory system: Secondary | ICD-10-CM | POA: Diagnosis not present

## 2019-10-08 DIAGNOSIS — Z01810 Encounter for preprocedural cardiovascular examination: Secondary | ICD-10-CM | POA: Diagnosis not present

## 2019-10-08 NOTE — Progress Notes (Signed)
Requested EKG tracing done 10/06/19 at [facility] office.   Surgical clearance in office visit note of Scott Gurley,PA dated 10/06/19-epic

## 2019-10-11 ENCOUNTER — Other Ambulatory Visit (HOSPITAL_COMMUNITY)
Admission: RE | Admit: 2019-10-11 | Discharge: 2019-10-11 | Disposition: A | Discharge status: Home / Self Care | Payer: PPO | Source: Ambulatory Visit | Attending: Orthopaedic Surgery | Admitting: Orthopaedic Surgery

## 2019-10-11 DIAGNOSIS — Z20822 Contact with and (suspected) exposure to covid-19: Secondary | ICD-10-CM | POA: Diagnosis not present

## 2019-10-11 DIAGNOSIS — M21612 Bunion of left foot: Secondary | ICD-10-CM | POA: Diagnosis not present

## 2019-10-11 DIAGNOSIS — Z01812 Encounter for preprocedural laboratory examination: Secondary | ICD-10-CM | POA: Diagnosis not present

## 2019-10-11 DIAGNOSIS — B351 Tinea unguium: Secondary | ICD-10-CM | POA: Diagnosis not present

## 2019-10-11 LAB — SARS CORONAVIRUS 2 (TAT 6-24 HRS): SARS Coronavirus 2: NEGATIVE

## 2019-10-11 NOTE — H&P (Signed)
TOTAL KNEE ADMISSION H&P  Patient is being admitted for left total knee arthroplasty.  Subjective:  Chief Complaint:left knee pain.  HPI: Allison Hunt, 69 y.o. female, has a history of pain and functional disability in the left knee due to arthritis and has failed non-surgical conservative treatments for greater than 12 weeks to includeNSAID's and/or analgesics, corticosteriod injections, viscosupplementation injections, flexibility and strengthening excercises, supervised PT with diminished ADL's post treatment, use of assistive devices, weight reduction as appropriate and activity modification.  Onset of symptoms was gradual, starting 5 years ago with gradually worsening course since that time. The patient noted prior procedures on the knee to include  arthroscopy on the left knee(s).  Patient currently rates pain in the left knee(s) at 10 out of 10 with activity. Patient has night pain, worsening of pain with activity and weight bearing, pain that interferes with activities of daily living, crepitus and joint swelling.  Patient has evidence of subchondral cysts, subchondral sclerosis, periarticular osteophytes and joint space narrowing by imaging studies.There is no active infection.  Patient Active Problem List   Diagnosis Date Noted  . Meniscus tear 01/04/2014  . Dyslipidemia 05/19/2012  . Atrial fibrillation 10/08/2011  . PONV (postoperative nausea and vomiting)   . Syncope, vasovagal   . Cardiac asystole [facility])    Past Medical History:  Diagnosis Date  . Arthritis    osteoarthritis both knees  . Cardiac asystole ([place])    a.  Felt to be 2/2 high vagal tone in setting of knee surgery;  b. 09/2011 Echo: EF 60-65%.  . Cervical cancer ([place]) 1984  . 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 right knee - s/p Knee surgery 09/2011  . Complication of anesthesia    a. Vasovagal syncope with bradycardia and asystole requiring brief chest compression 02/07/12 & hypotension  . Dental crowns present    . Gallbladder polyp   . GERD (gastroesophageal reflux disease)   . 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 History of kidney stones   . Hypercholesterolemia   . Insomnia   . Lateral meniscal tear 09/2011   a. right knee  . Liver hemangioma   . Mechanical ptosis of bilateral eyelids    upper eyelids  . Medial meniscus tear 09/2011   a. right knee  . Mitral valve prolapse   . Myogenic ptosis    left upper eyelid  . Paroxysmal atrial fibrillation ([place])    a. 09/2011 in setting of post-op knee  . PONV (postoperative nausea and vomiting)    flat line after prophol, hx of bp droppin also  . Positional vertigo   . Shortness of breath   . Syncope, vasovagal 2005   a. exacerbated by pain/surgical procedures    Past Surgical History:  Procedure Laterality Date  . BROW LIFT Bilateral 04/16/2018   Procedure: BLEPHAROPLASTY BOTH UPPER LIDS;  Surgeon: [name], MD;  Location: [place];  Service: Ophthalmology;  Laterality: Bilateral;  . COLONOSCOPY WITH PROPOFOL N/A 02/19/2017   Procedure: COLONOSCOPY WITH PROPOFOL;  Surgeon: Arta Silence, MD;  Location: [facility];  Service: Endoscopy;  Laterality: N/A;  . DILATION AND CURETTAGE OF UTERUS    . KIDNEY STONE SURGERY     denies  . KNEE ARTHROSCOPY  10/08/2011   Procedure: ARTHROSCOPY KNEE;  Surgeon: [name], MD;  Location: [place];  Service: Orthopedics;  Laterality: Right;  right knee arthroscopy partial  medial and lateral menisectomy and chondroplasty  . KNEE ARTHROSCOPY Left 01/04/2014   Procedure: ARTHROSCOPY KNEE;  Surgeon: [name], MD;  Location: [place];  Service: Orthopedics;  Laterality: Left;  Patient flatlined at outpatient surgery center when she had same surgery a year ago ended up in ICU for two days has had cardiac issues since 2013.  Marland Kitchen PATENT DUCTUS ARTERIOUS REPAIR  1956  . PTOSIS REPAIR Left 04/16/2018   Procedure:  PTOSIS REPAIR LEFT EYE;  Surgeon: [name], MD;  Location: [place];  Service: Ophthalmology;  Laterality: Left;  . TOTAL ABDOMINAL HYSTERECTOMY  1991   due to cervical cancer  . TUBAL LIGATION      No current facility-administered medications for this encounter.   Current Outpatient Medications  Medication Sig Dispense Refill Last Dose  . acetaminophen (TYLENOL) 500 MG tablet Take 500 mg by mouth every 6 (six) hours as needed for moderate pain.     Marland Kitchen atorvastatin (LIPITOR) 10 MG tablet Take 10 mg by mouth daily.      . Cholecalciferol (VITAMIN D) 50 MCG (2000 UT) CAPS Take 2,000 Units by mouth daily.     Marland Kitchen PREVIDENT 0.2 % SOLN Take 15 mLs by mouth at bedtime.      . raloxifene (EVISTA) 60 MG tablet Take 60 mg by mouth daily.  3   . zaleplon (SONATA) 10 MG capsule Take 10 mg by mouth at bedtime.       Allergies  Allergen Reactions  . Propofol Anaphylaxis and Other (See Comments)    Pt "flat lined" twice when she had propofol administered to her during another surgery  . Demerol Nausea And Vomiting  . Tramadol Nausea And Vomiting and Other (See Comments)    Pt must take with food to tolerate medication    Social History   Tobacco Use  . Smoking status: Former Research scientist (life sciences)  . Smokeless tobacco: Never Used  . Tobacco comment: quit smoking cigarettes in 1991  Substance Use Topics  . Alcohol use: Yes    Comment: 1 daily glass of wine    Family History  Problem Relation Age of Onset  . Anemia Father   . Heart attack Father   . Pulmonary embolism Father   . Colon cancer Father   . Alzheimer's disease Mother   . Lupus Sister   . Aortic stenosis Son        with AVR at 18 y/o  . Suicidality Paternal Grandfather   . Dementia Paternal Grandmother   . Colon cancer Maternal Grandmother      Review of Systems  Musculoskeletal: Positive for arthralgias.       Left knee  All other systems reviewed and are negative.   Objective:  Physical Exam  Constitutional: She is oriented  to person, place, and time. She appears well-developed and well-nourished.  HENT:  Head: Normocephalic and atraumatic.  Eyes: Pupils are equal, round, and reactive to light.  Cardiovascular: Normal rate and regular rhythm.  Respiratory: Effort normal.  GI: Soft.  Musculoskeletal:     Cervical back: Normal range of motion.     Comments: Both knees still move about 0-120.  She has medial joint line pain and crepitation left greater than right.  I do not feel an effusion on either side.  Hip motion is full and straight leg raise is negative.  Sensation and motor function are intact in her feet with palpable pulses on both sides.    Neurological: She is alert and  oriented to person, place, and time.  Skin: Skin is warm and dry.  Psychiatric: She has a normal mood and affect. Her behavior is normal. Judgment and thought content normal.    Vital signs in last 24 hours:    Labs:   Estimated body mass index is 28.94 kg/m as calculated from the following:   Height as of 10/07/19: 5\' 2"  (1.575 m).   Weight as of 10/07/19: 71.8 kg.   Imaging Review Plain radiographs demonstrate severe degenerative joint disease of the left knee(s). The overall alignment isneutral. The bone quality appears to be good for age and reported activity level.      Assessment/Plan:  End stage primary arthritis, left knee   The patient history, physical examination, clinical judgment of the provider and imaging studies are consistent with end stage degenerative joint disease of the left knee(s) and total knee arthroplasty is deemed medically necessary. The treatment options including medical management, injection therapy arthroscopy and arthroplasty were discussed at length. The risks and benefits of total knee arthroplasty were presented and reviewed. The risks due to aseptic loosening, infection, stiffness, patella tracking problems, thromboembolic complications and other imponderables were discussed. The patient  acknowledged the explanation, agreed to proceed with the plan and consent was signed. Patient is being admitted for inpatient treatment for surgery, pain control, PT, OT, prophylactic antibiotics, VTE prophylaxis, progressive ambulation and ADL's and discharge planning. The patient is planning to be discharged to skilled nursing facility     Patient's anticipated LOS is less than 2 midnights, meeting these requirements: - Younger than 44 - Lives within 1 hour of care - Has a competent adult at home to recover with post-op recover - NO history of  - Chronic pain requiring opiods  - Diabetes  - Coronary Artery Disease  - Heart failure  - Heart attack  - Stroke  - DVT/VTE  - Cardiac arrhythmia  - Respiratory Failure/COPD  - Renal failure  - Anemia  - Advanced Liver disease

## 2019-10-12 NOTE — Progress Notes (Signed)
Addendum to Anesthesia Review:  Placed on chart clearance from DR Orlinda Blalock - Cardiology in [place] done 10/08/19.

## 2019-10-12 NOTE — Progress Notes (Signed)
Rerequested ekg done at office of Josie Saunders [phone]) done 10/06/19 by fax.   Requested cardiology clearance done by [facility] in [place] done 10/08/19.  [phone]).

## 2019-10-12 NOTE — Progress Notes (Signed)
Anesthesia Chart Review   Case: [id number] Date/Time: 10/14/19 0715   Procedure: LEFT TOTAL KNEE ARTHROPLASTY (Left Knee)   Anesthesia type: Spinal   Pre-op diagnosis: LEFT KNEE DEGENERATIVE JOINT DISEASE   [facility]: [facility] ROOM 08 / [facility]   Surgeons: Melrose Nakayama, MD      DISCUSSION:69 y.o. former smoker with h/o PONV, PAF, MVP, GERD, vagal autonomic bradycardia, s/p repair of patent ductus arteriosus 1956, left knee DJD scheduled for above procedure 10/14/2019 with Dr. Melrose Nakayama.   Py seen by cardiology 10/08/2019 for preoperative evaluation.  Per OV note, "History of asystole after exposure to general anesthesia for right knee surgery in 2013.  Her previous cardiologist felt that she had increased vagal tone in the setting of post-op n/v, and she had had no further episodes.  She subsequently had surgery on the other knee in 2015 and had no problems.  She was not having CP or CHF symptoms and Dr. Quillian Quince did not feel that further investigation was warranted at that time.  No further w/u indicated.  Recommend prophylaxis/treatment of post-op n/v.  Is she has problems, treat with atropine and transcutaneous pacing as needed and call cardiology consult.  Risk of major cardiac event: 3.9%.  According to the ACC/AHA guidelines and functional ability, this patient is considered to be low risk for this scheduled operative procedure of knee surgery."  Anticipate pt can proceed with planned procedure barring acute status change.   VS: BP (!) 151/81 (BP Location: Right Arm)   Pulse 72   Temp 36.8 C (Oral)   Resp 18   Ht 5\' 2"  (1.575 m)   Wt 71.8 kg   SpO2 100%   BMI 28.94 kg/m   PROVIDERS: Baruch Goldmann, PA-C is PCP   Orlinda Blalock, MD is Cardiologist  LABS: Labs reviewed: Acceptable for surgery. (all labs ordered are listed, but only abnormal results are displayed)  Labs Reviewed  URINALYSIS, ROUTINE W REFLEX MICROSCOPIC - Abnormal; Notable for the following components:      Result Value    Specific Gravity, Urine 1.033 (*)    Ketones, ur 5 (*)    All other components within normal limits  SURGICAL PCR SCREEN  APTT  BASIC METABOLIC PANEL  CBC WITH DIFFERENTIAL/PLATELET  PROTIME-INR  TYPE AND SCREEN  ABO/RH     IMAGES:   EKG: Tracing requested  CV: Echo 10/08/2011 Study Conclusions   - Left ventricle: The cavity size was normal. Wall thickness  was increased in a pattern of mild LVH. Systolic function  was normal. The estimated ejection fraction was in the  range of 60% to 65%. Wall motion was normal; there were no  regional wall motion abnormalities.  - Aortic valve: There was no stenosis.  - Mitral valve: Trivial regurgitation.  - Right ventricle: The cavity size was normal. Systolic  function was normal.  - Pulmonary arteries: PA peak pressure: 45mm Hg (S).  - Inferior vena cava: The vessel was normal in size; the  respirophasic diameter changes were in the normal range (=  50%); findings are consistent with normal central venous  pressure.  - Pericardium, extracardiac: A trivial pericardial effusion  was identified posterior to the heart.  Impressions:   - Normal LV size with mild LV hypertrophy. EF 60-65%. Normal  RV size and systolic function. No significant valvular  abnormalities.   Past Medical History:  Diagnosis Date  . Arthritis    osteoarthritis both knees  . Cardiac asystole ([place])    a.  Felt to be 2/2 high vagal tone in setting of knee surgery;  b. 09/2011 Echo: EF 60-65%.  . Cervical cancer ([place]) 1984  . 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 right knee - s/p Knee surgery 09/2011  . Complication of anesthesia    a. Vasovagal syncope with bradycardia and asystole requiring brief chest compression 02/07/12 & hypotension  . Dental crowns present   . Gallbladder polyp   . GERD (gastroesophageal reflux disease)   . 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 History of  kidney stones   . Hypercholesterolemia   . Insomnia   . Lateral meniscal tear 09/2011   a. right knee  . Liver hemangioma   . Mechanical ptosis of bilateral eyelids    upper eyelids  . Medial meniscus tear 09/2011   a. right knee  . Mitral valve prolapse   . Myogenic ptosis    left upper eyelid  . Paroxysmal atrial fibrillation ([place])    a. 09/2011 in setting of post-op knee  . PONV (postoperative nausea and vomiting)    flat line after prophol, hx of bp droppin also  . Positional vertigo   . Shortness of breath   . Syncope, vasovagal 2005   a. exacerbated by pain/surgical procedures    Past Surgical History:  Procedure Laterality Date  . BROW LIFT Bilateral 04/16/2018   Procedure: BLEPHAROPLASTY BOTH UPPER LIDS;  Surgeon: [name], MD;  Location: [place];  Service: Ophthalmology;  Laterality: Bilateral;  . COLONOSCOPY WITH PROPOFOL N/A 02/19/2017   Procedure: COLONOSCOPY WITH PROPOFOL;  Surgeon: Arta Silence, MD;  [facility];  Service: Endoscopy;  Laterality: N/A;  . DILATION AND CURETTAGE OF UTERUS    . KIDNEY STONE SURGERY     denies  . KNEE ARTHROSCOPY  10/08/2011   Procedure: ARTHROSCOPY KNEE;  Surgeon: [name], MD;  Location: [place];  Service: Orthopedics;  Laterality: Right;  right knee arthroscopy partial medial and lateral menisectomy and chondroplasty  . KNEE ARTHROSCOPY Left 01/04/2014   Procedure: ARTHROSCOPY KNEE;  Surgeon: [name], MD;  Location: [place];  Service: Orthopedics;  Laterality: Left;  Patient flatlined at outpatient surgery center when she had same surgery a year ago ended up in ICU for two days has had cardiac issues since 2013.  Marland Kitchen PATENT DUCTUS ARTERIOUS REPAIR  1956  . PTOSIS REPAIR Left 04/16/2018   Procedure: PTOSIS REPAIR LEFT EYE;  Surgeon: [name], MD;  Location: [place];  Service: Ophthalmology;  Laterality: Left;  . TOTAL ABDOMINAL HYSTERECTOMY  1991   due to cervical cancer  .  TUBAL LIGATION      MEDICATIONS: . acetaminophen (TYLENOL) 500 MG tablet  . atorvastatin (LIPITOR) 10 MG tablet  . Cholecalciferol (VITAMIN D) 50 MCG (2000 UT) CAPS  . PREVIDENT 0.2 % SOLN  . raloxifene (EVISTA) 60 MG tablet  . zaleplon (SONATA) 10 MG capsule   No current facility-administered medications for this encounter.

## 2019-10-13 MED ORDER — TRANEXAMIC ACID 1000 MG/10ML IV SOLN
2000.0000 mg | INTRAVENOUS | Status: DC
  Filled 2019-10-13: qty 20

## 2019-10-13 MED ORDER — BUPIVACAINE LIPOSOME 1.3 % IJ SUSP
20.0000 mL | Freq: Once | INTRAMUSCULAR | Status: DC
  Filled 2019-10-13: qty 20

## 2019-10-13 NOTE — Anesthesia Preprocedure Evaluation (Addendum)
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  Per OV note, "History of asystole after exposure to general anesthesia for right knee surgery in 2013.  Her previous cardiologist felt that she had increased vagal tone in the setting of post-op n/v, and she had had no further episodes.  She subsequently had surgery on the other knee in 2015 and had no problems.  She was not having CP or CHF symptoms and Dr. Quillian Quince did not feel that further investigation was warranted at that time.  No further w/u indicated.  Recommend prophylaxis/treatment of post-op n/v.  Is she has problems, treat with atropine and transcutaneous pacing as needed and call cardiology consult.  Risk of major cardiac event: 3.9%.  According to the ACC/AHA guidelines and functional ability, this patient is considered to be low risk for this scheduled operative procedure of knee surgery."  Echo 2013: Normal LV size with mild LV hypertrophy. EF 60-65%. Normal RV size and  systolic function. No significant valvular abnormalities.   Reproductive/Obstetrics                            Anesthesia Physical Anesthesia Plan  ASA: II  Anesthesia Plan: Spinal   Post-op Pain Management:    Induction:   PONV Risk Score and Plan: 3 and Propofol infusion, Treatment may vary due to age or medical condition, Ondansetron, TIVA and Scopolamine patch - Pre-op  Airway Management Planned: Nasal Cannula and Simple Face Mask  Additional Equipment: None  Intra-op Plan:   Post-operative Plan:   Informed Consent: I have reviewed the patients History and Physical, chart, labs and discussed the procedure including the risks, benefits and alternatives for the proposed anesthesia with the patient or authorized representative who has indicated his/her understanding and acceptance.       Plan Discussed with:   Anesthesia Plan Comments:        Anesthesia Quick Evaluation

## 2019-10-14 ENCOUNTER — Encounter (HOSPITAL_COMMUNITY): Payer: Self-pay | Admitting: Orthopaedic Surgery

## 2019-10-14 ENCOUNTER — Other Ambulatory Visit: Payer: Self-pay

## 2019-10-14 ENCOUNTER — Ambulatory Visit (HOSPITAL_COMMUNITY): Payer: PPO | Admitting: Physician Assistant

## 2019-10-14 ENCOUNTER — Encounter (HOSPITAL_COMMUNITY)
Admission: RE | Disposition: A | Discharge status: Skilled Nursing Facility | Payer: Self-pay | Source: Home / Self Care | Attending: Orthopaedic Surgery

## 2019-10-14 ENCOUNTER — Inpatient Hospital Stay (HOSPITAL_COMMUNITY)
Admission: RE | Admit: 2019-10-14 | Discharge: 2019-10-16 | DRG: 470 | Disposition: A | Discharge status: Skilled Nursing Facility | Payer: PPO | Attending: Orthopaedic Surgery | Admitting: Orthopaedic Surgery

## 2019-10-14 ENCOUNTER — Ambulatory Visit (HOSPITAL_COMMUNITY): Payer: PPO | Admitting: Anesthesiology

## 2019-10-14 DIAGNOSIS — Z8674 Personal history of sudden cardiac arrest: Secondary | ICD-10-CM

## 2019-10-14 DIAGNOSIS — I341 Nonrheumatic mitral (valve) prolapse: Secondary | ICD-10-CM | POA: Diagnosis not present

## 2019-10-14 DIAGNOSIS — Z8541 Personal history of malignant neoplasm of cervix uteri: Secondary | ICD-10-CM

## 2019-10-14 DIAGNOSIS — Z800 Family history of malignant neoplasm of digestive organs: Secondary | ICD-10-CM | POA: Diagnosis not present

## 2019-10-14 DIAGNOSIS — Z832 Family history of diseases of the blood and blood-forming organs and certain disorders involving the immune mechanism: Secondary | ICD-10-CM | POA: Diagnosis not present

## 2019-10-14 DIAGNOSIS — E785 Hyperlipidemia, unspecified: Secondary | ICD-10-CM | POA: Diagnosis present

## 2019-10-14 DIAGNOSIS — Z20822 Contact with and (suspected) exposure to covid-19: Secondary | ICD-10-CM | POA: Diagnosis present

## 2019-10-14 DIAGNOSIS — Z96652 Presence of left artificial knee joint: Secondary | ICD-10-CM | POA: Diagnosis not present

## 2019-10-14 DIAGNOSIS — E559 Vitamin D deficiency, unspecified: Secondary | ICD-10-CM | POA: Diagnosis not present

## 2019-10-14 DIAGNOSIS — G4700 Insomnia, unspecified: Secondary | ICD-10-CM | POA: Diagnosis not present

## 2019-10-14 DIAGNOSIS — Z7901 Long term (current) use of anticoagulants: Secondary | ICD-10-CM | POA: Diagnosis not present

## 2019-10-14 DIAGNOSIS — I480 Paroxysmal atrial fibrillation: Secondary | ICD-10-CM | POA: Diagnosis present

## 2019-10-14 DIAGNOSIS — K5900 Constipation, unspecified: Secondary | ICD-10-CM | POA: Diagnosis not present

## 2019-10-14 DIAGNOSIS — K219 Gastro-esophageal reflux disease without esophagitis: Secondary | ICD-10-CM | POA: Diagnosis present

## 2019-10-14 DIAGNOSIS — Z820 Family history of epilepsy and other diseases of the nervous system: Secondary | ICD-10-CM

## 2019-10-14 DIAGNOSIS — M810 Age-related osteoporosis without current pathological fracture: Secondary | ICD-10-CM | POA: Diagnosis not present

## 2019-10-14 DIAGNOSIS — E7800 Pure hypercholesterolemia, unspecified: Secondary | ICD-10-CM | POA: Diagnosis present

## 2019-10-14 DIAGNOSIS — Z8249 Family history of ischemic heart disease and other diseases of the circulatory system: Secondary | ICD-10-CM

## 2019-10-14 DIAGNOSIS — Z4789 Encounter for other orthopedic aftercare: Secondary | ICD-10-CM | POA: Diagnosis not present

## 2019-10-14 DIAGNOSIS — Z885 Allergy status to narcotic agent status: Secondary | ICD-10-CM | POA: Diagnosis not present

## 2019-10-14 DIAGNOSIS — I4891 Unspecified atrial fibrillation: Secondary | ICD-10-CM | POA: Diagnosis not present

## 2019-10-14 DIAGNOSIS — Z87891 Personal history of nicotine dependence: Secondary | ICD-10-CM

## 2019-10-14 DIAGNOSIS — N200 Calculus of kidney: Secondary | ICD-10-CM | POA: Diagnosis not present

## 2019-10-14 DIAGNOSIS — M8580 Other specified disorders of bone density and structure, unspecified site: Secondary | ICD-10-CM | POA: Diagnosis not present

## 2019-10-14 DIAGNOSIS — M1712 Unilateral primary osteoarthritis, left knee: Secondary | ICD-10-CM | POA: Diagnosis present

## 2019-10-14 DIAGNOSIS — F5104 Psychophysiologic insomnia: Secondary | ICD-10-CM | POA: Diagnosis not present

## 2019-10-14 DIAGNOSIS — G8918 Other acute postprocedural pain: Secondary | ICD-10-CM | POA: Diagnosis not present

## 2019-10-14 DIAGNOSIS — M25562 Pain in left knee: Secondary | ICD-10-CM | POA: Diagnosis not present

## 2019-10-14 DIAGNOSIS — R110 Nausea: Secondary | ICD-10-CM | POA: Diagnosis not present

## 2019-10-14 DIAGNOSIS — M62838 Other muscle spasm: Secondary | ICD-10-CM | POA: Diagnosis not present

## 2019-10-14 HISTORY — PX: TOTAL KNEE ARTHROPLASTY: SHX125

## 2019-10-14 LAB — TYPE AND SCREEN
ABO/RH(D): A POS
Antibody Screen: NEGATIVE

## 2019-10-14 SURGERY — ARTHROPLASTY, KNEE, TOTAL
Anesthesia: Spinal | Site: Knee | Laterality: Left

## 2019-10-14 MED ORDER — TRANEXAMIC ACID-NACL 1000-0.7 MG/100ML-% IV SOLN
1000.0000 mg | INTRAVENOUS | Status: AC
  Administered 2019-10-14: 1000 mg via INTRAVENOUS
  Filled 2019-10-14: qty 100

## 2019-10-14 MED ORDER — MORPHINE SULFATE (PF) 2 MG/ML IV SOLN: 0.5000 mg | INTRAVENOUS | Status: DC | PRN

## 2019-10-14 MED ORDER — METHOCARBAMOL 500 MG PO TABS
500.0000 mg | ORAL_TABLET | Freq: Four times a day (QID) | ORAL | Status: DC | PRN
  Administered 2019-10-15 – 2019-10-16 (×3): 500 mg via ORAL
  Filled 2019-10-14 (×3): qty 1

## 2019-10-14 MED ORDER — ACETAMINOPHEN 325 MG PO TABS
325.0000 mg | ORAL_TABLET | Freq: Four times a day (QID) | ORAL | Status: DC | PRN
  Administered 2019-10-15 – 2019-10-16 (×2): 650 mg via ORAL
  Filled 2019-10-14 (×2): qty 2

## 2019-10-14 MED ORDER — CEFAZOLIN SODIUM-DEXTROSE 2-4 GM/100ML-% IV SOLN
2.0000 g | INTRAVENOUS | Status: AC
  Administered 2019-10-14: 2 g via INTRAVENOUS
  Filled 2019-10-14: qty 100

## 2019-10-14 MED ORDER — HYDROCODONE-ACETAMINOPHEN 7.5-325 MG PO TABS
1.0000 | ORAL_TABLET | ORAL | Status: DC | PRN
  Administered 2019-10-15 – 2019-10-16 (×3): 1 via ORAL
  Administered 2019-10-16: 2 via ORAL
  Filled 2019-10-14 (×3): qty 1
  Filled 2019-10-14: qty 2

## 2019-10-14 MED ORDER — BUPIVACAINE IN DEXTROSE 0.75-8.25 % IT SOLN
INTRATHECAL | Status: DC | PRN
Start: 2019-10-14 — End: 2019-10-14
  Administered 2019-10-14: 1.8 mL via INTRATHECAL

## 2019-10-14 MED ORDER — BUPIVACAINE-EPINEPHRINE 0.5% -1:200000 IJ SOLN
INTRAMUSCULAR | Status: AC
  Filled 2019-10-14: qty 1

## 2019-10-14 MED ORDER — SODIUM FLUORIDE 0.2 % MT SOLN: 15.0000 mL | Freq: Every day | OROMUCOSAL | Status: DC

## 2019-10-14 MED ORDER — ONDANSETRON HCL 4 MG/2ML IJ SOLN: 4.0000 mg | Freq: Once | INTRAMUSCULAR | Status: DC | PRN

## 2019-10-14 MED ORDER — RALOXIFENE HCL 60 MG PO TABS
60.0000 mg | ORAL_TABLET | Freq: Every day | ORAL | Status: DC
  Filled 2019-10-14 (×3): qty 1

## 2019-10-14 MED ORDER — LIDOCAINE 2% (20 MG/ML) 5 ML SYRINGE
INTRAMUSCULAR | Status: DC | PRN
  Administered 2019-10-14: 100 mg via INTRAVENOUS

## 2019-10-14 MED ORDER — SODIUM CHLORIDE (PF) 0.9 % IJ SOLN
INTRAMUSCULAR | Status: AC
  Filled 2019-10-14: qty 50

## 2019-10-14 MED ORDER — ONDANSETRON HCL 4 MG/2ML IJ SOLN
INTRAMUSCULAR | Status: DC | PRN
  Administered 2019-10-14: 4 mg via INTRAVENOUS

## 2019-10-14 MED ORDER — KETOROLAC TROMETHAMINE 15 MG/ML IJ SOLN
7.5000 mg | Freq: Four times a day (QID) | INTRAMUSCULAR | Status: AC
  Administered 2019-10-14 (×3): 7.5 mg via INTRAVENOUS
  Filled 2019-10-14 (×4): qty 1

## 2019-10-14 MED ORDER — PHENYLEPHRINE HCL-NACL 10-0.9 MG/250ML-% IV SOLN
INTRAVENOUS | Status: AC
  Filled 2019-10-14: qty 500

## 2019-10-14 MED ORDER — ASPIRIN 81 MG PO CHEW
81.0000 mg | CHEWABLE_TABLET | Freq: Two times a day (BID) | ORAL | Status: DC
  Administered 2019-10-15 – 2019-10-16 (×3): 81 mg via ORAL
  Filled 2019-10-14 (×3): qty 1

## 2019-10-14 MED ORDER — PROPOFOL 500 MG/50ML IV EMUL
INTRAVENOUS | Status: AC
  Filled 2019-10-14: qty 50

## 2019-10-14 MED ORDER — FENTANYL CITRATE (PF) 100 MCG/2ML IJ SOLN
INTRAMUSCULAR | Status: AC
  Filled 2019-10-14: qty 2

## 2019-10-14 MED ORDER — ONDANSETRON HCL 4 MG/2ML IJ SOLN
4.0000 mg | Freq: Four times a day (QID) | INTRAMUSCULAR | Status: DC | PRN
  Administered 2019-10-14: 4 mg via INTRAVENOUS
  Filled 2019-10-14: qty 2

## 2019-10-14 MED ORDER — CEFAZOLIN SODIUM-DEXTROSE 2-4 GM/100ML-% IV SOLN
2.0000 g | Freq: Four times a day (QID) | INTRAVENOUS | Status: AC
  Administered 2019-10-14 (×2): 2 g via INTRAVENOUS
  Filled 2019-10-14 (×2): qty 100

## 2019-10-14 MED ORDER — POVIDONE-IODINE 10 % EX SWAB
2.0000 "application " | Freq: Once | CUTANEOUS | Status: AC
  Administered 2019-10-14: 2 via TOPICAL

## 2019-10-14 MED ORDER — PHENOL 1.4 % MT LIQD: 1.0000 | OROMUCOSAL | Status: DC | PRN

## 2019-10-14 MED ORDER — DEXAMETHASONE SODIUM PHOSPHATE 10 MG/ML IJ SOLN
INTRAMUSCULAR | Status: DC | PRN
  Administered 2019-10-14: 10 mg via INTRAVENOUS

## 2019-10-14 MED ORDER — ONDANSETRON HCL 4 MG PO TABS
4.0000 mg | ORAL_TABLET | Freq: Four times a day (QID) | ORAL | Status: DC | PRN
  Administered 2019-10-15 – 2019-10-16 (×3): 4 mg via ORAL
  Filled 2019-10-14 (×3): qty 1

## 2019-10-14 MED ORDER — LACTATED RINGERS IV SOLN: INTRAVENOUS | Status: DC

## 2019-10-14 MED ORDER — OXYCODONE HCL 5 MG PO TABS: 5.0000 mg | ORAL_TABLET | Freq: Once | ORAL | Status: DC | PRN

## 2019-10-14 MED ORDER — 0.9 % SODIUM CHLORIDE (POUR BTL) OPTIME
TOPICAL | Status: DC | PRN
  Administered 2019-10-14: 1000 mL

## 2019-10-14 MED ORDER — PROPOFOL 10 MG/ML IV BOLUS
INTRAVENOUS | Status: AC
  Filled 2019-10-14: qty 40

## 2019-10-14 MED ORDER — DIPHENHYDRAMINE HCL 12.5 MG/5ML PO ELIX: 12.5000 mg | ORAL_SOLUTION | ORAL | Status: DC | PRN

## 2019-10-14 MED ORDER — BUPIVACAINE LIPOSOME 1.3 % IJ SUSP
INTRAMUSCULAR | Status: DC | PRN
  Administered 2019-10-14: 20 mL

## 2019-10-14 MED ORDER — OXYCODONE HCL 5 MG/5ML PO SOLN: 5.0000 mg | Freq: Once | ORAL | Status: DC | PRN

## 2019-10-14 MED ORDER — DEXAMETHASONE SODIUM PHOSPHATE 10 MG/ML IJ SOLN
INTRAMUSCULAR | Status: AC
  Filled 2019-10-14: qty 1

## 2019-10-14 MED ORDER — PHENYLEPHRINE HCL-NACL 10-0.9 MG/250ML-% IV SOLN
INTRAVENOUS | Status: DC | PRN
Start: 2019-10-14 — End: 2019-10-14
  Administered 2019-10-14: 25 ug/min via INTRAVENOUS

## 2019-10-14 MED ORDER — DOCUSATE SODIUM 100 MG PO CAPS
100.0000 mg | ORAL_CAPSULE | Freq: Two times a day (BID) | ORAL | Status: DC
  Filled 2019-10-14 (×3): qty 1

## 2019-10-14 MED ORDER — ALUM & MAG HYDROXIDE-SIMETH 200-200-20 MG/5ML PO SUSP: 30.0000 mL | ORAL | Status: DC | PRN

## 2019-10-14 MED ORDER — MIDAZOLAM HCL 2 MG/2ML IJ SOLN
INTRAMUSCULAR | Status: AC
  Filled 2019-10-14: qty 2

## 2019-10-14 MED ORDER — MIDAZOLAM HCL 5 MG/5ML IJ SOLN
INTRAMUSCULAR | Status: DC | PRN
  Administered 2019-10-14: 2 mg via INTRAVENOUS

## 2019-10-14 MED ORDER — PROPOFOL 10 MG/ML IV BOLUS
INTRAVENOUS | Status: DC | PRN
  Administered 2019-10-14: 20 mg via INTRAVENOUS
  Administered 2019-10-14: 30 mg via INTRAVENOUS

## 2019-10-14 MED ORDER — SODIUM CHLORIDE 0.9 % IR SOLN
Status: DC | PRN
  Administered 2019-10-14: 1000 mL

## 2019-10-14 MED ORDER — METOCLOPRAMIDE HCL 5 MG/ML IJ SOLN: 5.0000 mg | Freq: Three times a day (TID) | INTRAMUSCULAR | Status: DC | PRN

## 2019-10-14 MED ORDER — TRANEXAMIC ACID 1000 MG/10ML IV SOLN
INTRAVENOUS | Status: DC | PRN
  Administered 2019-10-14: 2000 mg via TOPICAL

## 2019-10-14 MED ORDER — FENTANYL CITRATE (PF) 100 MCG/2ML IJ SOLN
INTRAMUSCULAR | Status: DC | PRN
  Administered 2019-10-14 (×2): 50 ug via INTRAVENOUS

## 2019-10-14 MED ORDER — SODIUM CHLORIDE (PF) 0.9 % IJ SOLN
INTRAMUSCULAR | Status: DC | PRN
  Administered 2019-10-14: 30 mL via INTRAVENOUS

## 2019-10-14 MED ORDER — MENTHOL 3 MG MT LOZG: 1.0000 | LOZENGE | OROMUCOSAL | Status: DC | PRN

## 2019-10-14 MED ORDER — PROPOFOL 500 MG/50ML IV EMUL
INTRAVENOUS | Status: DC | PRN
  Administered 2019-10-14: 85 ug/kg/min via INTRAVENOUS

## 2019-10-14 MED ORDER — BUPIVACAINE-EPINEPHRINE 0.5% -1:200000 IJ SOLN
INTRAMUSCULAR | Status: DC | PRN
  Administered 2019-10-14: 30 mL

## 2019-10-14 MED ORDER — ATORVASTATIN CALCIUM 10 MG PO TABS
10.0000 mg | ORAL_TABLET | Freq: Every day | ORAL | Status: DC
  Administered 2019-10-14 – 2019-10-15 (×2): 10 mg via ORAL
  Filled 2019-10-14 (×2): qty 1

## 2019-10-14 MED ORDER — METHOCARBAMOL 500 MG IVPB - SIMPLE MED
500.0000 mg | Freq: Four times a day (QID) | INTRAVENOUS | Status: DC | PRN
  Filled 2019-10-14: qty 50

## 2019-10-14 MED ORDER — TRANEXAMIC ACID-NACL 1000-0.7 MG/100ML-% IV SOLN
1000.0000 mg | Freq: Once | INTRAVENOUS | Status: AC
  Administered 2019-10-14: 1000 mg via INTRAVENOUS
  Filled 2019-10-14: qty 100

## 2019-10-14 MED ORDER — ROPIVACAINE HCL 5 MG/ML IJ SOLN
INTRAMUSCULAR | Status: DC | PRN
  Administered 2019-10-14: 30 mL via PERINEURAL

## 2019-10-14 MED ORDER — FENTANYL CITRATE (PF) 100 MCG/2ML IJ SOLN: 25.0000 ug | INTRAMUSCULAR | Status: DC | PRN

## 2019-10-14 MED ORDER — ONDANSETRON HCL 4 MG/2ML IJ SOLN
INTRAMUSCULAR | Status: AC
  Filled 2019-10-14: qty 2

## 2019-10-14 MED ORDER — LIDOCAINE 2% (20 MG/ML) 5 ML SYRINGE
INTRAMUSCULAR | Status: AC
  Filled 2019-10-14: qty 5

## 2019-10-14 MED ORDER — HYDROCODONE-ACETAMINOPHEN 5-325 MG PO TABS
1.0000 | ORAL_TABLET | ORAL | Status: DC | PRN
  Administered 2019-10-14 – 2019-10-15 (×2): 1 via ORAL
  Filled 2019-10-14 (×2): qty 1

## 2019-10-14 MED ORDER — BISACODYL 5 MG PO TBEC: 5.0000 mg | DELAYED_RELEASE_TABLET | Freq: Every day | ORAL | Status: DC | PRN

## 2019-10-14 MED ORDER — PROPOFOL 1000 MG/100ML IV EMUL
INTRAVENOUS | Status: AC
  Filled 2019-10-14: qty 300

## 2019-10-14 MED ORDER — METOCLOPRAMIDE HCL 5 MG PO TABS: 5.0000 mg | ORAL_TABLET | Freq: Three times a day (TID) | ORAL | Status: DC | PRN

## 2019-10-14 MED ORDER — ACETAMINOPHEN 500 MG PO TABS
500.0000 mg | ORAL_TABLET | Freq: Four times a day (QID) | ORAL | Status: AC
  Administered 2019-10-14 (×3): 500 mg via ORAL
  Filled 2019-10-14 (×4): qty 1

## 2019-10-14 MED ORDER — SCOPOLAMINE 1 MG/3DAYS TD PT72
MEDICATED_PATCH | TRANSDERMAL | Status: DC | PRN
  Administered 2019-10-14: 1 via TRANSDERMAL

## 2019-10-14 SURGICAL SUPPLY — 56 items
ATTUNE MED DOME PAT 32 KNEE (Knees) ×2 IMPLANT
ATTUNE MED DOME PAT 32MM KNEE (Knees) ×1 IMPLANT
ATTUNE PSFEM LTSZ4 NARCEM KNEE (Femur) ×3 IMPLANT
ATTUNE PSRP INSR SZ4 6 KNEE (Insert) ×2 IMPLANT
ATTUNE PSRP INSR SZ4 6MM KNEE (Insert) ×1 IMPLANT
BAG DECANTER FOR FLEXI CONT (MISCELLANEOUS) ×3 IMPLANT
BAG SPEC THK2 15X12 ZIP CLS (MISCELLANEOUS) ×1
BAG ZIPLOCK 12X15 (MISCELLANEOUS) ×3 IMPLANT
BASE TIBIAL ROT PLAT SZ 3 KNEE (Knees) ×1 IMPLANT
BLADE SAGITTAL 25.0X1.19X90 (BLADE) ×2 IMPLANT
BLADE SAGITTAL 25.0X1.19X90MM (BLADE) ×1
BLADE SAW SGTL 11.0X1.19X90.0M (BLADE) ×3 IMPLANT
BNDG ELASTIC 6X5.8 VLCR STR LF (GAUZE/BANDAGES/DRESSINGS) ×3 IMPLANT
BOOTIES KNEE HIGH SLOAN (MISCELLANEOUS) ×3 IMPLANT
BOWL SMART MIX CTS (DISPOSABLE) ×3 IMPLANT
BSPLAT TIB 3 CMNT ROT PLAT STR (Knees) ×1 IMPLANT
CEMENT HV SMART SET (Cement) ×6 IMPLANT
COVER SURGICAL LIGHT HANDLE (MISCELLANEOUS) ×3 IMPLANT
COVER WAND RF STERILE (DRAPES) ×3 IMPLANT
CUFF TOURN SGL QUICK 34 (TOURNIQUET CUFF) ×3
CUFF TRNQT CYL 34X4.125X (TOURNIQUET CUFF) ×1 IMPLANT
DECANTER SPIKE VIAL GLASS SM (MISCELLANEOUS) ×6 IMPLANT
DRAPE SHEET LG 3/4 BI-LAMINATE (DRAPES) ×3 IMPLANT
DRAPE TOP 10253 STERILE (DRAPES) ×3 IMPLANT
DRAPE U-SHAPE 47X51 STRL (DRAPES) ×3 IMPLANT
DRESSING AQUACEL AG SP 3.5X10 (GAUZE/BANDAGES/DRESSINGS) ×1 IMPLANT
DRSG AQUACEL AG ADV 3.5X10 (GAUZE/BANDAGES/DRESSINGS) ×3 IMPLANT
DRSG AQUACEL AG SP 3.5X10 (GAUZE/BANDAGES/DRESSINGS) ×3
DURAPREP 26ML APPLICATOR (WOUND CARE) ×6 IMPLANT
ELECT REM PT RETURN 15FT ADLT (MISCELLANEOUS) ×3 IMPLANT
GLOVE BIO SURGEON STRL SZ8 (GLOVE) ×6 IMPLANT
GLOVE BIOGEL PI IND STRL 8 (GLOVE) ×2 IMPLANT
GLOVE BIOGEL PI INDICATOR 8 (GLOVE) ×4
GOWN STRL REUS W/TWL XL LVL3 (GOWN DISPOSABLE) ×6 IMPLANT
HANDPIECE INTERPULSE COAX TIP (DISPOSABLE) ×3
HOLDER FOLEY CATH W/STRAP (MISCELLANEOUS) IMPLANT
HOOD PEEL AWAY FLYTE STAYCOOL (MISCELLANEOUS) ×9 IMPLANT
KIT TURNOVER KIT A (KITS) IMPLANT
MANIFOLD NEPTUNE II (INSTRUMENTS) ×3 IMPLANT
NS IRRIG 1000ML POUR BTL (IV SOLUTION) ×3 IMPLANT
PACK TOTAL KNEE CUSTOM (KITS) ×3 IMPLANT
PAD ARMBOARD 7.5X6 YLW CONV (MISCELLANEOUS) ×3 IMPLANT
PENCIL SMOKE EVACUATOR (MISCELLANEOUS) IMPLANT
PIN DRILL FIX HALF THREAD (BIT) ×3 IMPLANT
PIN STEINMAN FIXATION KNEE (PIN) ×3 IMPLANT
PROTECTOR NERVE ULNAR (MISCELLANEOUS) ×3 IMPLANT
SET HNDPC FAN SPRY TIP SCT (DISPOSABLE) ×1 IMPLANT
SUT ETHIBOND NAB CT1 #1 30IN (SUTURE) ×6 IMPLANT
SUT VIC AB 0 CT1 36 (SUTURE) ×3 IMPLANT
SUT VIC AB 2-0 CT1 27 (SUTURE) ×3
SUT VIC AB 2-0 CT1 TAPERPNT 27 (SUTURE) ×1 IMPLANT
SUT VICRYL AB 3-0 FS1 BRD 27IN (SUTURE) ×3 IMPLANT
TIBIAL BASE ROT PLAT SZ 3 KNEE (Knees) ×3 IMPLANT
TRAY FOLEY MTR SLVR 16FR STAT (SET/KITS/TRAYS/PACK) IMPLANT
WATER STERILE IRR 1000ML POUR (IV SOLUTION) ×3 IMPLANT
WRAP KNEE MAXI GEL POST OP (GAUZE/BANDAGES/DRESSINGS) ×3 IMPLANT

## 2019-10-14 NOTE — Evaluation (Signed)
Physical Therapy Evaluation Patient Details Name: Allison Hunt MRN: [id number] DOB: 1951/03/09 Today's Date: 10/14/2019   History of Present Illness  Pt s/p L TKR and with hx of MVP, positional vertigo and Cervical CA  Clinical Impression  Pt s/p L TKR and presents with decreased L LE strength/ROM, post op pain and dizziness/nausea with OOB activity limiting functional mobility.  Pt plans dc to rehab setting to maximize IND and safety prior to return home ALONE.    Follow Up Recommendations Follow surgeon's recommendation for DC plan and follow-up therapies    Equipment Recommendations  None recommended by PT    Recommendations for Other Services       Precautions / Restrictions Precautions Precautions: Fall;Knee Restrictions Weight Bearing Restrictions: No Other Position/Activity Restrictions: WBAT      Mobility  Bed Mobility Overal bed mobility: Needs Assistance Bed Mobility: Supine to Sit     Supine to sit: Min assist     General bed mobility comments: cues for sequence and use of R LE to self assist  Transfers Overall transfer level: Needs assistance Equipment used: Rolling walker (2 wheeled) Transfers: Sit to/from Stand Sit to Stand: Min assist         General transfer comment: cues for LE management and use of UEs to self assist  Ambulation/Gait Ambulation/Gait assistance: Min assist;+2 physical assistance;+2 safety/equipment Gait Distance (Feet): 7 Feet Assistive device: Rolling walker (2 wheeled) Gait Pattern/deviations: Step-to pattern;Decreased step length - right;Decreased step length - left;Shuffle;Trunk flexed Gait velocity: decr   General Gait Details: cues for posture, position from RW and sequence; distance ltd by onset dizziness  Stairs            Wheelchair Mobility    Modified Rankin (Stroke Patients Only)       Balance Overall balance assessment: Needs assistance Sitting-balance support: No upper extremity  supported;Feet supported Sitting balance-Leahy Scale: Fair     Standing balance support: Bilateral upper extremity supported Standing balance-Leahy Scale: Poor                               Pertinent Vitals/Pain Pain Assessment: 0-10 Pain Score: 3  Pain Location: L knee Pain Descriptors / Indicators: Aching;Sore Pain Intervention(s): Premedicated before session;Monitored during session;Limited activity within patient's tolerance;Ice applied    Home Living Family/patient expects to be discharged to:: Skilled nursing facility Living Arrangements: Alone                    Prior Function Level of Independence: Independent               Hand Dominance        Extremity/Trunk Assessment   Upper Extremity Assessment Upper Extremity Assessment: Overall WFL for tasks assessed    Lower Extremity Assessment Lower Extremity Assessment: LLE deficits/detail    Cervical / Trunk Assessment Cervical / Trunk Assessment: Normal  Communication   Communication: No difficulties  Cognition Arousal/Alertness: Awake/alert Behavior During Therapy: WFL for tasks assessed/performed Overall Cognitive Status: Within Functional Limits for tasks assessed                                        General Comments      Exercises Total Joint Exercises Ankle Circles/Pumps: AROM;Both;15 reps;Supine   Assessment/Plan    PT Assessment Patient needs continued PT services  PT Problem List  Decreased strength;Decreased range of motion;Decreased activity tolerance;Decreased balance;Decreased mobility;Decreased knowledge of use of DME;Pain;Decreased knowledge of precautions       PT Treatment Interventions DME instruction;Gait training;Stair training;Functional mobility training;Therapeutic activities;Therapeutic exercise;Balance training;Patient/family education    PT Goals (Current goals can be found in the Care Plan section)  Acute Rehab PT Goals Patient  Stated Goal: Rehab and then home PT Goal Formulation: With patient Time For Goal Achievement: 10/28/19 Potential to Achieve Goals: Good    Frequency 7X/week   Barriers to discharge        Co-evaluation               AM-PAC PT "6 Clicks" Mobility  Outcome Measure Help needed turning from your back to your side while in a flat bed without using bedrails?: A Little Help needed moving from lying on your back to sitting on the side of a flat bed without using bedrails?: A Little Help needed moving to and from a bed to a chair (including a wheelchair)?: A Little Help needed standing up from a chair using your arms (e.g., wheelchair or bedside chair)?: A Little Help needed to walk in hospital room?: A Little Help needed climbing 3-5 steps with a railing? : A Lot 6 Click Score: 17    End of Session Equipment Utilized During Treatment: Gait belt Activity Tolerance: Patient limited by fatigue;Other (comment)(orthostatic) Patient left: in chair;with call bell/phone within reach;with chair alarm set Nurse Communication: Mobility status PT Visit Diagnosis: Unsteadiness on feet (R26.81);Difficulty in walking, not elsewhere classified (R26.2)    Time: [id number] PT Time Calculation (min) (ACUTE ONLY): 43 min   Charges:   PT Evaluation $PT Eval Low Complexity: 1 Low PT Treatments $Gait Training: 8-22 mins        Debe Coder PT Acute Rehabilitation Services Pager [phone] Office [phone]   Lurlene Ronda 10/14/2019, 5:11 PM

## 2019-10-14 NOTE — Transfer of Care (Signed)
Immediate Anesthesia Transfer of Care Note  Patient: Allison Hunt  Procedure(s) Performed: Procedure(s): LEFT TOTAL KNEE ARTHROPLASTY (Left)  Patient Location: PACU  Anesthesia Type:Spinal  Level of Consciousness:  sedated, patient cooperative and responds to stimulation  Airway & Oxygen Therapy:Patient Spontanous Breathing and Patient connected to face mask oxgen  Post-op Assessment:  Report given to PACU RN and Post -op Vital signs reviewed and stable  Post vital signs:  Reviewed and stable  Last Vitals:  Vitals:   10/14/19 0550  BP: (!) 161/92  Pulse: 89  Resp: 15  Temp: 36.8 C  SpO2: [id number]    Complications: No apparent anesthesia complications

## 2019-10-14 NOTE — Anesthesia Procedure Notes (Signed)
Anesthesia Regional Block: Adductor canal block   Pre-Anesthetic Checklist: ,, timeout performed, Correct Patient, Correct Site, Correct Laterality, Correct Procedure, Correct Position, site marked, Risks and benefits discussed,  Surgical consent,  Pre-op evaluation,  At surgeon's request and post-op pain management  Laterality: Left  Prep: chloraprep       Needles:  Injection technique: Single-shot  Needle Type: Echogenic Stimulator Needle     Needle Length: 10cm  Needle Gauge: 21     Additional Needles:   Procedures:,,,, ultrasound used (permanent image in chart),,,,  Narrative:  Start time: 10/14/2019 6:55 AM End time: 10/14/2019 7:01 AM Injection made incrementally with aspirations every 5 mL.  Performed by: Personally  Anesthesiologist: [name], MD  Additional Notes: Monitors applied. Injection made in 5cc increments. No resistance to injection. Good needle visualization. Patient tolerated procedure well.

## 2019-10-14 NOTE — Plan of Care (Signed)
  Problem: Education: °Goal: Knowledge of the prescribed therapeutic regimen will improve °Outcome: Progressing °Goal: Individualized Educational Video(s) °Outcome: Progressing °  °Problem: Activity: °Goal: Ability to avoid complications of mobility impairment will improve °Outcome: Progressing °Goal: Range of joint motion will improve °Outcome: Progressing °  °Problem: Clinical Measurements: °Goal: Postoperative complications will be avoided or minimized °Outcome: Progressing °  °

## 2019-10-14 NOTE — Anesthesia Procedure Notes (Signed)
Spinal  Patient location during procedure: OR Staffing Performed: anesthesiologist  Anesthesiologist: Walton Digilio E, MD Preanesthetic Checklist Completed: patient identified, IV checked, risks and benefits discussed, surgical consent, monitors and equipment checked, pre-op evaluation and timeout performed Spinal Block Patient position: sitting Prep: DuraPrep and site prepped and draped Patient monitoring: continuous pulse ox, blood pressure and heart rate Approach: midline Location: L3-4 Injection technique: single-shot Needle Needle type: Pencan  Needle gauge: 24 G Needle length: 9 cm Additional Notes Functioning IV was confirmed and monitors were applied. Sterile prep and drape, including hand hygiene and sterile gloves were used. The patient was positioned and the spine was prepped. The skin was anesthetized with lidocaine.  Free flow of clear CSF was obtained prior to injecting local anesthetic into the CSF. The needle was carefully withdrawn. The patient tolerated the procedure well.      

## 2019-10-14 NOTE — Interval H&P Note (Signed)
History and Physical Interval Note:  10/14/2019 7:21 AM  Allison Hunt  has presented today for surgery, with the diagnosis of LEFT KNEE DEGENERATIVE JOINT DISEASE.  The various methods of treatment have been discussed with the patient and family. After consideration of risks, benefits and other options for treatment, the patient has consented to  Procedure(s): LEFT TOTAL KNEE ARTHROPLASTY (Left) as a surgical intervention.  The patient's history has been reviewed, patient examined, no change in status, stable for surgery.  I have reviewed the patient's chart and labs.  Questions were answered to the patient's satisfaction.     [name]

## 2019-10-14 NOTE — Op Note (Signed)
PREOP DIAGNOSIS: DJD LEFT KNEE POSTOP DIAGNOSIS:  same PROCEDURE: LEFT TKR ANESTHESIA: Spinal and MAC ATTENDING SURGEON: [name] ASSISTANT: Allison Dolly PA  INDICATIONS FOR PROCEDURE: Allison Hunt is a 69 y.o. female who has struggled for a long time with pain due to degenerative arthritis of the left knee.  The patient has failed many conservative non-operative measures and at this point has pain which limits the ability to sleep and walk.  The patient is offered total knee replacement.  Informed operative consent was obtained after discussion of possible risks of anesthesia, infection, neurovascular injury, DVT, and death.  The importance of the post-operative rehabilitation protocol to optimize result was stressed extensively with the patient.  SUMMARY OF FINDINGS AND PROCEDURE:  Allison Hunt was taken to the operative suite where under the above anesthesia a left knee replacement was performed.  There were advanced degenerative changes and the bone quality was fair.  We used the DePuyAttune system and placed size 4 narrow femur, 3 tibia, 32 mm all polyethylene patella, and a size 6 mm spacer.  Allison Dolly PA-C assisted throughout and was invaluable to the completion of the case in that he helped retract and maintain exposure while I placed the components.  He also helped close thereby minimizing OR time.  The patient was admitted for appropriate post-op care to include perioperative antibiotics and mechanical and pharmacologic measures for DVT prophylaxis.  DESCRIPTION OF PROCEDURE:  Allison Hunt was taken to the operative suite where the above anesthesia was applied.  The patient was positioned supine and prepped and draped in normal sterile fashion.  An appropriate time out was performed.  After the administration of kefzol pre-op antibiotic the leg was elevated and exsanguinated and a tourniquet inflated.  A standard longitudinal incision was made on the anterior knee.   Dissection was carried down to the extensor mechanism.  All appropriate anti-infective measures were used including the pre-operative antibiotic, betadine impregnated drape, and closed hooded exhaust systems for each member of the surgical team.  A medial parapatellar incision was made in the extensor mechanism and the knee cap flipped and the knee flexed.  Some residual meniscal tissues were removed along with any remaining ACL/PCL tissue.  A guide was placed on the tibia and a flat cut was made on it's superior surface.  An intramedullary guide was placed in the femur and was utilized to make anterior and posterior cuts creating an appropriate flexion gap.  A second intramedullary guide was placed in the femur to make a distal cut properly balancing the knee with an extension gap equal to the flexion gap.  The three bones sized to the above mentioned sizes and the appropriate guides were placed and utilized.  A trial reduction was done and the knee easily came to full extension and the patella tracked well on flexion.  The trial components were removed and all bones were cleaned with pulsatile lavage and then dried thoroughly.  Cement was mixed and was pressurized onto the bones followed by placement of the aforementioned components.  Excess cement was trimmed and pressure was held on the components until the cement had hardened.  The tourniquet was deflated and a small amount of bleeding was controlled with cautery and pressure.  The knee was irrigated thoroughly.  The extensor mechanism was re-approximated with #1 ethibond in interrupted fashion.  The knee was flexed and the repair was solid.  The subcutaneous tissues were re-approximated with #0 and #2-0 vicryl and the skin closed with  a subcuticular stitch and steristrips.  A sterile dressing was applied.  Intraoperative fluids, EBL, and tourniquet time can be obtained from anesthesia records.  DISPOSITION:  The patient was taken to recovery room in stable  condition and admitted for appropriate post-op care to include peri-operative antibiotic and DVT prophylaxis with mechanical and pharmacologic measures.  Monico Blitz Allison Hunt 10/14/2019, 9:09 AM

## 2019-10-14 NOTE — Anesthesia Postprocedure Evaluation (Signed)
Anesthesia Post Note  Patient: Allison Hunt  Procedure(s) Performed: LEFT TOTAL KNEE ARTHROPLASTY (Left Knee)     Patient location during evaluation: PACU Anesthesia Type: Spinal Level of consciousness: oriented and awake and alert Pain management: pain level controlled Vital Signs Assessment: post-procedure vital signs reviewed and stable Respiratory status: spontaneous breathing, respiratory function stable and nonlabored ventilation Cardiovascular status: blood pressure returned to baseline and stable Postop Assessment: no headache, no backache, no apparent nausea or vomiting and spinal receding Anesthetic complications: no    Last Vitals:  Vitals:   10/14/19 1126 10/14/19 1232  BP: 129/65 (!) 143/66  Pulse: 63 78  Resp: 16 17  Temp: 36.8 C 36.6 C  SpO2: 100% 100%    Last Pain:  Vitals:   10/14/19 1126  TempSrc: Oral  PainSc:                  [name]

## 2019-10-15 ENCOUNTER — Encounter: Payer: Self-pay | Admitting: *Deleted

## 2019-10-15 DIAGNOSIS — Z832 Family history of diseases of the blood and blood-forming organs and certain disorders involving the immune mechanism: Secondary | ICD-10-CM | POA: Diagnosis not present

## 2019-10-15 DIAGNOSIS — Z87891 Personal history of nicotine dependence: Secondary | ICD-10-CM | POA: Diagnosis not present

## 2019-10-15 DIAGNOSIS — Z885 Allergy status to narcotic agent status: Secondary | ICD-10-CM | POA: Diagnosis not present

## 2019-10-15 DIAGNOSIS — E7800 Pure hypercholesterolemia, unspecified: Secondary | ICD-10-CM | POA: Diagnosis present

## 2019-10-15 DIAGNOSIS — Z20822 Contact with and (suspected) exposure to covid-19: Secondary | ICD-10-CM | POA: Diagnosis present

## 2019-10-15 DIAGNOSIS — I480 Paroxysmal atrial fibrillation: Secondary | ICD-10-CM | POA: Diagnosis present

## 2019-10-15 DIAGNOSIS — Z820 Family history of epilepsy and other diseases of the nervous system: Secondary | ICD-10-CM | POA: Diagnosis not present

## 2019-10-15 DIAGNOSIS — Z8674 Personal history of sudden cardiac arrest: Secondary | ICD-10-CM | POA: Diagnosis not present

## 2019-10-15 DIAGNOSIS — M1712 Unilateral primary osteoarthritis, left knee: Secondary | ICD-10-CM | POA: Diagnosis present

## 2019-10-15 DIAGNOSIS — Z800 Family history of malignant neoplasm of digestive organs: Secondary | ICD-10-CM | POA: Diagnosis not present

## 2019-10-15 DIAGNOSIS — K219 Gastro-esophageal reflux disease without esophagitis: Secondary | ICD-10-CM | POA: Diagnosis present

## 2019-10-15 DIAGNOSIS — Z8249 Family history of ischemic heart disease and other diseases of the circulatory system: Secondary | ICD-10-CM | POA: Diagnosis not present

## 2019-10-15 DIAGNOSIS — Z8541 Personal history of malignant neoplasm of cervix uteri: Secondary | ICD-10-CM | POA: Diagnosis not present

## 2019-10-15 DIAGNOSIS — E785 Hyperlipidemia, unspecified: Secondary | ICD-10-CM | POA: Diagnosis present

## 2019-10-15 MED ORDER — ASPIRIN 81 MG PO CHEW: 81.0000 mg | CHEWABLE_TABLET | Freq: Two times a day (BID) | ORAL | 0 refills | Status: DC

## 2019-10-15 MED ORDER — HYDROCODONE-ACETAMINOPHEN 5-325 MG PO TABS: 1.0000 | ORAL_TABLET | Freq: Four times a day (QID) | ORAL | 0 refills | Status: DC | PRN

## 2019-10-15 MED ORDER — ONDANSETRON HCL 4 MG PO TABS: 4.0000 mg | ORAL_TABLET | Freq: Four times a day (QID) | ORAL | 0 refills | Status: DC | PRN

## 2019-10-15 MED ORDER — TIZANIDINE HCL 4 MG PO TABS
4.0000 mg | ORAL_TABLET | Freq: Four times a day (QID) | ORAL | 1 refills | Status: DC | PRN
Start: 2019-10-15 — End: 2020-08-17

## 2019-10-15 NOTE — Progress Notes (Signed)
Subjective: 1 Day Post-Op Procedure(s) (LRB): LEFT TOTAL KNEE ARTHROPLASTY (Left)   Patient feels better this morning. She is hoping that a bed will open up at [facility] this weekend so she can go there. She does not have anyone that can help her at home so she is looking to go to a SNF.  Activity level:  wbat Diet tolerance:  ok Voiding:  ok Patient reports pain as mild.    Objective: Vital signs in last 24 hours: Temp:  [95.7 F (35.4 C)-98.5 F (36.9 C)] 98.4 F (36.9 C) (05/07 0629) Pulse Rate:  [62-81] 64 (05/07 0629) Resp:  [10-20] 18 (05/07 0629) BP: (116-155)/(54-113) 148/61 (05/07 0629) SpO2:  [92 %-100 %] 98 % (05/07 0629)  Labs: No results for input(s): HGB in the last 72 hours. No results for input(s): WBC, RBC, HCT, PLT in the last 72 hours. No results for input(s): NA, K, CL, CO2, BUN, CREATININE, GLUCOSE, CALCIUM in the last 72 hours. No results for input(s): LABPT, INR in the last 72 hours.  Physical Exam:  Neurologically intact ABD soft Neurovascular intact Sensation intact distally Intact pulses distally Dorsiflexion/Plantar flexion intact Incision: dressing C/D/I and no drainage No cellulitis present Compartment soft  Assessment/Plan:  1 Day Post-Op Procedure(s) (LRB): LEFT TOTAL KNEE ARTHROPLASTY (Left) Advance diet Up with therapy Discharge to SNF once cleared by PT and a bed is available. Continue on ASA 81mg  BID x 2 weeks post op. Follow up in office 2 weeks post op.  Larwance Sachs Lindaann Gradilla 10/15/2019, 8:14 AM

## 2019-10-15 NOTE — Plan of Care (Signed)
  Problem: Activity: Goal: Ability to avoid complications of mobility impairment will improve Outcome: Progressing Goal: Range of joint motion will improve Outcome: Progressing   Problem: Pain Management: Goal: Pain level will decrease with appropriate interventions Outcome: Progressing   Problem: Health Behavior/Discharge Planning: Goal: Ability to manage health-related needs will improve Outcome: Progressing   Problem: Activity: Goal: Risk for activity intolerance will decrease Outcome: Progressing   Problem: Elimination: Goal: Will not experience complications related to bowel motility Outcome: Progressing Goal: Will not experience complications related to urinary retention Outcome: Progressing   Problem: Safety: Goal: Ability to remain free from injury will improve Outcome: Progressing

## 2019-10-15 NOTE — Progress Notes (Signed)
Physical Therapy Treatment Patient Details Name: Allison Hunt MRN: [id number] DOB: [birth year] Today's Date: 10/15/2019    History of Present Illness Pt s/p L TKR and with hx of MVP, positional vertigo and Cervical CA    PT Comments    Pt motivated and progressing with mobility this am - pt with min pain and no c/o nausea/dizziness.   Follow Up Recommendations  Follow surgeon's recommendation for DC plan and follow-up therapies     Equipment Recommendations  None recommended by PT    Recommendations for Other Services       Precautions / Restrictions Precautions Precautions: Fall;Knee Restrictions Weight Bearing Restrictions: No Other Position/Activity Restrictions: WBAT    Mobility  Bed Mobility Overal bed mobility: Needs Assistance Bed Mobility: Supine to Sit     Supine to sit: Min assist     General bed mobility comments: Increased time with cues for sequence and use of R LE to self assist  Transfers Overall transfer level: Needs assistance Equipment used: Rolling walker (2 wheeled) Transfers: Sit to/from Stand Sit to Stand: Min assist         General transfer comment: cues for LE management and use of UEs to self assist  Ambulation/Gait Ambulation/Gait assistance: Min assist Gait Distance (Feet): 34 Feet Assistive device: Rolling walker (2 wheeled) Gait Pattern/deviations: Step-to pattern;Decreased step length - right;Decreased step length - left;Shuffle;Trunk flexed Gait velocity: decr   General Gait Details: cues for posture, position from RW and sequence; distance ltd by fatigue   Stairs             Wheelchair Mobility    Modified Rankin (Stroke Patients Only)       Balance Overall balance assessment: Needs assistance Sitting-balance support: No upper extremity supported;Feet supported Sitting balance-Leahy Scale: Good     Standing balance support: Bilateral upper extremity supported Standing balance-Leahy Scale: Poor                               Cognition Arousal/Alertness: Awake/alert Behavior During Therapy: WFL for tasks assessed/performed Overall Cognitive Status: Within Functional Limits for tasks assessed                                        Exercises Total Joint Exercises Ankle Circles/Pumps: AROM;Both;15 reps;Supine Quad Sets: AROM;Both;10 reps;Supine Heel Slides: AAROM;Left;15 reps;Supine Straight Leg Raises: AAROM;Left;10 reps;Supine Goniometric ROM: AAROM L knee -5 - 75    General Comments        Pertinent Vitals/Pain Pain Assessment: 0-10 Pain Score: 2  Pain Location: L knee Pain Descriptors / Indicators: Aching;Sore Pain Intervention(s): Limited activity within patient's tolerance;Monitored during session;Ice applied(pt declining meds 2* minimal pain)    Home Living                      Prior Function            PT Goals (current goals can now be found in the care plan section) Acute Rehab PT Goals Patient Stated Goal: Rehab and then home PT Goal Formulation: With patient Time For Goal Achievement: 10/28/19 Potential to Achieve Goals: Good Progress towards PT goals: Progressing toward goals    Frequency    7X/week      PT Plan Current plan remains appropriate    Co-evaluation  AM-PAC PT "6 Clicks" Mobility   Outcome Measure  Help needed turning from your back to your side while in a flat bed without using bedrails?: A Little Help needed moving from lying on your back to sitting on the side of a flat bed without using bedrails?: A Little Help needed moving to and from a bed to a chair (including a wheelchair)?: A Little Help needed standing up from a chair using your arms (e.g., wheelchair or bedside chair)?: A Little Help needed to walk in hospital room?: A Little Help needed climbing 3-5 steps with a railing? : A Lot 6 Click Score: 17    End of Session Equipment Utilized During Treatment: Gait  belt Activity Tolerance: Patient tolerated treatment well;Patient limited by fatigue Patient left: in chair;with call bell/phone within reach;with chair alarm set Nurse Communication: Mobility status PT Visit Diagnosis: Unsteadiness on feet (R26.81);Difficulty in walking, not elsewhere classified (R26.2)     Time: [id number] PT Time Calculation (min) (ACUTE ONLY): 45 min  Charges:  $Gait Training: 8-22 mins $Therapeutic Exercise: 8-22 mins $Therapeutic Activity: 8-22 mins                     Debe Coder PT Acute Rehabilitation Services Pager [phone] Office [phone]    Blase Beckner 10/15/2019, 10:38 AM

## 2019-10-15 NOTE — TOC Progression Note (Signed)
Transition of Care [facility]) - Progression Note    Patient Details  Name: Allison Hunt MRN: [id number] Date of Birth: [birth year]  [facility]) CM/SW Contact  Lennart Pall, LCSW Phone Number: 10/15/2019, 3:26 PM  Clinical Narrative:   Have received 2 SNF bed offers and pt has accepted bed at [facility] with plan to transfer to facility tomorrow.  Have alerted RN as well as Loni Dolly, PA-C.  Pt to arrange for a friend to provide transportation.  Insurance authorization received x 6 days - auth # [id number].    Expected Discharge Plan: [place] Barriers to Discharge: Continued Medical Work up  Expected Discharge Plan and Services Expected Discharge Plan: [place] In-house Referral: Clinical Social Work     Living arrangements for the past 2 months: Single Family Home                                       Social Determinants of Health (SDOH) Interventions    Readmission Risk Interventions No flowsheet data found.

## 2019-10-15 NOTE — TOC Initial Note (Signed)
Transition of Care [facility]) - Initial/Assessment Note    Patient Details  Name: Allison Hunt MRN: [id number] Date of Birth: 1951-03-10  [facility]) CM/SW Contact:    Lennart Pall, LCSW Phone Number: 10/15/2019, 11:01 AM  Clinical Narrative:  Met with pt this morning to review d/c plans.  Pt reports that she had arranged/ discussed plan prior to surgery to go to short term rehab and then home.  Pt notes she lives alone and has to be independent to manage at home.  She had hoped to go to [facility], however, they do not have a bed available.  Reviewed Medicare list of other facilities and will send FL2 out to facilities in both [place]/ [facility] are per pt request.               Expected Discharge Plan: Skilled Nursing Facility Barriers to Discharge: Continued Medical Work up   Patient Goals and CMS Choice Patient states their goals for this hospitalization and ongoing recovery are:: to go for short term rehab then home CMS Medicare.gov Compare Post Acute Care list provided to:: Patient Choice offered to / list presented to : Patient  Expected Discharge Plan and Services Expected Discharge Plan: [place] In-house Referral: Clinical Social Work     Living arrangements for the past 2 months: Single Family Home                                      Prior Living Arrangements/Services Living arrangements for the past 2 months: Single Family Home Lives with:: Self Patient language and need for interpreter reviewed:: Yes Do you feel safe going back to the place where you live?: Yes(after completion of rehab)      Need for Family Participation in Patient Care: No (Comment) Care giver support system in place?: No (comment)   Criminal Activity/Legal Involvement Pertinent to Current Situation/Hospitalization: No - Comment as needed  Activities of Daily Living Home Assistive Devices/Equipment: Walker (specify type)(4 wheel) ADL  Screening (condition at time of admission) Patient's cognitive ability adequate to safely complete daily activities?: Yes Is the patient deaf or have difficulty hearing?: No Does the patient have difficulty seeing, even when wearing glasses/contacts?: No Does the patient have difficulty concentrating, remembering, or making decisions?: No Patient able to express need for assistance with ADLs?: Yes Does the patient have difficulty dressing or bathing?: No Independently performs ADLs?: Yes (appropriate for developmental age) Does the patient have difficulty walking or climbing stairs?: Yes Weakness of Legs: None Weakness of Arms/Hands: None  Permission Sought/Granted Permission sought to share information with : Facility Sport and exercise psychologist                Emotional Assessment Appearance:: Appears stated age Attitude/Demeanor/Rapport: Engaged, Self-Confident Affect (typically observed): Appropriate, Stable Orientation: : Oriented to Self, Oriented to Place, Oriented to  Time, Oriented to Situation Alcohol / Substance Use: Not Applicable Psych Involvement: No (comment)  Admission diagnosis:  Primary osteoarthritis of left knee [M17.12] Patient Active Problem List   Diagnosis Date Noted  . Primary osteoarthritis of left knee 10/14/2019  . Meniscus tear 01/04/2014  . Dyslipidemia 05/19/2012  . Atrial fibrillation 10/08/2011  . PONV (postoperative nausea and vomiting)   . Syncope, vasovagal   . Cardiac asystole ([place])    PCP:  Baruch Goldmann, PA-C Pharmacy:   [facility]  [address] [place] [address] Phone: [phone] Fax: [phone]     Social Determinants of Health (SDOH) Interventions    Readmission Risk Interventions No flowsheet data found.

## 2019-10-15 NOTE — Discharge Summary (Signed)
Patient ID: Allison Hunt MRN: [id number] DOB/AGE: 07/05/1950 69 y.o.  Admit date: 10/14/2019 Discharge date: 10/16/2019  Admission Diagnoses:  Principal Problem:   Primary osteoarthritis of left knee   Discharge Diagnoses:  Same  Past Medical History:  Diagnosis Date  . Arthritis    osteoarthritis both knees  . Cardiac asystole ([place])    a.  Felt to be 2/2 high vagal tone in setting of knee surgery;  b. 09/2011 Echo: EF 60-65%.  . Cervical cancer ([place]) 1984  . 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 right knee - s/p Knee surgery 09/2011  . Complication of anesthesia    a. Vasovagal syncope with bradycardia and asystole requiring brief chest compression 02/07/12 & hypotension  . Dental crowns present   . Gallbladder polyp   . GERD (gastroesophageal reflux disease)   . 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 History of kidney stones   . Hypercholesterolemia   . Insomnia   . Lateral meniscal tear 09/2011   a. right knee  . Liver hemangioma   . Mechanical ptosis of bilateral eyelids    upper eyelids  . Medial meniscus tear 09/2011   a. right knee  . Mitral valve prolapse   . Myogenic ptosis    left upper eyelid  . Paroxysmal atrial fibrillation ([place])    a. 09/2011 in setting of post-op knee  . PONV (postoperative nausea and vomiting)    flat line after prophol, hx of bp droppin also  . Positional vertigo   . Shortness of breath   . Syncope, vasovagal 2005   a. exacerbated by pain/surgical procedures    Surgeries: Procedure(s): LEFT TOTAL KNEE ARTHROPLASTY on 10/14/2019   Consultants:   Discharged Condition: Improved  Hospital Course: Adriauna Kamen is an 69 y.o. female who was admitted 10/14/2019 for operative treatment ofPrimary osteoarthritis of left knee. Patient has severe unremitting pain that affects sleep, daily activities, and work/hobbies. After pre-op clearance the patient was taken to the  operating room on 10/14/2019 and underwent  Procedure(s): LEFT TOTAL KNEE ARTHROPLASTY.    Patient was given perioperative antibiotics:  Anti-infectives (From admission, onward)   Start     Dose/Rate Route Frequency Ordered Stop   10/14/19 1330  ceFAZolin (ANCEF) IVPB 2g/100 mL premix     2 g 200 mL/hr over 30 Minutes Intravenous Every 6 hours 10/14/19 1127 10/14/19 2026   10/14/19 0600  ceFAZolin (ANCEF) IVPB 2g/100 mL premix     2 g 200 mL/hr over 30 Minutes Intravenous On call to O.R. 10/14/19 0534 10/14/19 0740       Patient was given sequential compression devices, early ambulation, and chemoprophylaxis to prevent DVT.  Patient benefited maximally from hospital stay and there were no complications.    Recent vital signs:  Patient Vitals for the past 24 hrs:  BP Temp Temp src Pulse Resp SpO2  10/15/19 1323 (!) 146/73 98.8 F (37.1 C) - 75 17 100 %  10/15/19 1036 (!) 143/62 98.7 F (37.1 C) Oral 69 17 98 %  10/15/19 0629 (!) 148/61 98.4 F (36.9 C) Oral 64 18 98 %  10/15/19 0129 (!) 126/113 98.2 F (36.8 C) Oral 72 16 100 %  10/14/19 2118 (!) 147/67 98.5 F (36.9 C) Oral 65 16 98 %  10/14/19 1809 (!) 128/59 98 F (36.7 C) - 70 18 100 %  10/14/19 1612 (!) 116/54 98.1 F (36.7 C) -  72 17 92 %     Recent laboratory studies: No results for input(s): WBC, HGB, HCT, PLT, NA, K, CL, CO2, BUN, CREATININE, GLUCOSE, INR, CALCIUM in the last 72 hours.  Invalid input(s): PT, 2   Discharge Medications:   Allergies as of 10/15/2019      Reactions   Propofol Anaphylaxis, Other (See Comments)   Pt "flat lined" twice when she had propofol administered to her during another surgery   Demerol Nausea And Vomiting   Tramadol Nausea And Vomiting, Other (See Comments)   Pt must take with food to tolerate medication      Medication List    TAKE these medications   acetaminophen 500 MG tablet Commonly known as: TYLENOL Take 500 mg by mouth every 6 (six) hours as needed for moderate  pain.   aspirin 81 MG chewable tablet Chew 1 tablet (81 mg total) by mouth 2 (two) times daily.   atorvastatin 10 MG tablet Commonly known as: LIPITOR Take 10 mg by mouth daily.   HYDROcodone-acetaminophen 5-325 MG tablet Commonly known as: NORCO/VICODIN Take 1-2 tablets by mouth every 6 (six) hours as needed for moderate pain (post op pain).   ondansetron 4 MG tablet Commonly known as: ZOFRAN Take 1 tablet (4 mg total) by mouth every 6 (six) hours as needed for nausea.   PreviDent 0.2 % Soln Generic drug: SODIUM FLUORIDE (DENTAL RINSE) Take 15 mLs by mouth at bedtime.   raloxifene 60 MG tablet Commonly known as: EVISTA Take 60 mg by mouth daily.   tiZANidine 4 MG tablet Commonly known as: Zanaflex Take 1 tablet (4 mg total) by mouth every 6 (six) hours as needed for muscle spasms.   Vitamin D 50 MCG (2000 UT) Caps Take 2,000 Units by mouth daily.   zaleplon 10 MG capsule Commonly known as: SONATA Take 10 mg by mouth at bedtime.            Durable Medical Equipment  (From admission, onward)         Start     Ordered   10/14/19 1128  DME Walker rolling  Once    Question:  Patient needs a walker to treat with the following condition  Answer:  Primary osteoarthritis of left knee   10/14/19 1127   10/14/19 1128  DME 3 n 1  Once     10/14/19 1127   10/14/19 1128  DME Bedside commode  Once    Question:  Patient needs a bedside commode to treat with the following condition  Answer:  Primary osteoarthritis of left knee   10/14/19 1127          Diagnostic Studies: DG Chest 2 View  Result Date: 10/07/2019 CLINICAL DATA:  69 year old female under preoperative evaluation. EXAM: CHEST - 2 VIEW COMPARISON:  Chest x-ray 05/24/2014. FINDINGS: Lung volumes are normal. No consolidative airspace disease. No pleural effusions. No pneumothorax. No pulmonary nodule or mass noted. Pulmonary vasculature and the cardiomediastinal silhouette are within normal limits. IMPRESSION:  No radiographic evidence of acute cardiopulmonary disease. Electronically Signed   By: Vinnie Langton M.D.   On: 10/07/2019 14:47    Disposition:      Contact information for follow-up providers    Melrose Nakayama, MD. Schedule an appointment as soon as possible for a visit in 2 weeks.   Specialty: Orthopedic Surgery Contact information: [place] [address] [phone]            Contact information for after-discharge care  Destination    [facility] Preferred SNF .   Service: Skilled Nursing Contact information: [place] [phone]                   Signed: Larwance Sachs Deni Lefever 10/15/2019, 4:00 PM

## 2019-10-15 NOTE — NC FL2 (Signed)
[place] LEVEL OF CARE SCREENING TOOL     IDENTIFICATION  Patient Name: Allison Hunt Birthdate: 1950-11-01 Sex: female Admission Date (Current Location): 10/14/2019  [facility] and [place] Number:  Herbalist and Address:  [facility],  [place] [address], [place]      Provider Number: [id number]  Attending Physician Name and Address:  Melrose Nakayama, MD  Relative Name and Phone Number:       Current Level of Care: Hospital Recommended Level of Care: [place] Prior Approval Number:    Date Approved/Denied:   PASRR Number: [id number]  Discharge Plan: SNF    Current Diagnoses: Patient Active Problem List   Diagnosis Date Noted  . Primary osteoarthritis of left knee 10/14/2019  . Meniscus tear 01/04/2014  . Dyslipidemia 05/19/2012  . Atrial fibrillation 10/08/2011  . PONV (postoperative nausea and vomiting)   . Syncope, vasovagal   . Cardiac asystole ([facility])     Orientation RESPIRATION BLADDER Height & Weight     Self, Time, Situation, Place  Normal Continent Weight: 158 lb 4 oz (71.8 kg) Height:  5\' 2"  (157.5 cm)  BEHAVIORAL SYMPTOMS/MOOD NEUROLOGICAL BOWEL NUTRITION STATUS      Continent Diet(regular)  AMBULATORY STATUS COMMUNICATION OF NEEDS Skin   Limited Assist Verbally Surgical wounds                       Personal Care Assistance Level of Assistance  Bathing, Dressing Bathing Assistance: Limited assistance   Dressing Assistance: Limited assistance     Functional Limitations Info             SPECIAL CARE FACTORS FREQUENCY  PT (By licensed PT), OT (By licensed OT)     PT Frequency: 5x/wk OT Frequency: 5x/wk            Contractures Contractures Info: Not present    Additional Factors Info  Code Status, Allergies Code Status Info: Full Allergies Info: see MAR           Current Medications (10/15/2019):  This is the current hospital active medication list Current  Facility-Administered Medications  Medication Dose Route Frequency Provider Last Rate Last Admin  . acetaminophen (TYLENOL) tablet 325-650 mg  325-650 mg Oral Q6H PRN Loni Dolly, PA-C      . acetaminophen (TYLENOL) tablet 500 mg  500 mg Oral Q6H Loni Dolly, PA-C   500 mg at 10/14/19 2328  . alum & mag hydroxide-simeth (MAALOX/MYLANTA) 200-200-20 MG/5ML suspension 30 mL  30 mL Oral Q4H PRN Loni Dolly, PA-C      . aspirin chewable tablet 81 mg  81 mg Oral BID Loni Dolly, PA-C      . atorvastatin (LIPITOR) tablet 10 mg  10 mg Oral q1800 Loni Dolly, PA-C   10 mg at 10/14/19 1806  . bisacodyl (DULCOLAX) EC tablet 5 mg  5 mg Oral Daily PRN Loni Dolly, PA-C      . diphenhydrAMINE (BENADRYL) 12.5 MG/5ML elixir 12.5-25 mg  12.5-25 mg Oral Q4H PRN Loni Dolly, PA-C      . docusate sodium (COLACE) capsule 100 mg  100 mg Oral BID Loni Dolly, PA-C      . HYDROcodone-acetaminophen (NORCO) 7.5-325 MG per tablet 1-2 tablet  1-2 tablet Oral Q4H PRN Loni Dolly, PA-C      . HYDROcodone-acetaminophen (NORCO/VICODIN) 5-325 MG per tablet 1-2 tablet  1-2 tablet Oral Q4H PRN Loni Dolly, PA-C   1 tablet at 10/14/19 1315  .  ketorolac (TORADOL) 15 MG/ML injection 7.5 mg  7.5 mg Intravenous Q6H Loni Dolly, PA-C   7.5 mg at 10/14/19 2332  . lactated ringers infusion   Intravenous Continuous Loni Dolly, PA-C 50 mL/hr at 10/14/19 1225 New Bag at 10/14/19 1225  . menthol-cetylpyridinium (CEPACOL) lozenge 3 mg  1 lozenge Oral PRN Loni Dolly, PA-C       Or  . phenol (CHLORASEPTIC) mouth spray 1 spray  1 spray Mouth/Throat PRN Loni Dolly, PA-C      . methocarbamol (ROBAXIN) tablet 500 mg  500 mg Oral Q6H PRN Loni Dolly, PA-C       Or  . methocarbamol (ROBAXIN) 500 mg in dextrose 5 % 50 mL IVPB  500 mg Intravenous Q6H PRN Loni Dolly, PA-C      . metoCLOPramide (REGLAN) tablet 5-10 mg  5-10 mg Oral Q8H PRN Loni Dolly, PA-C       Or  . metoCLOPramide (REGLAN) injection 5-10 mg  5-10 mg Intravenous  Q8H PRN Loni Dolly, PA-C      . morphine 2 MG/ML injection 0.5-1 mg  0.5-1 mg Intravenous Q2H PRN Loni Dolly, PA-C      . ondansetron (ZOFRAN) tablet 4 mg  4 mg Oral Q6H PRN Loni Dolly, PA-C       Or  . ondansetron (ZOFRAN) injection 4 mg  4 mg Intravenous Q6H PRN Loni Dolly, PA-C   4 mg at 10/14/19 1621  . raloxifene (EVISTA) tablet 60 mg  60 mg Oral Daily Loni Dolly, PA-C         Discharge Medications: Please see discharge summary for a list of discharge medications.  Relevant Imaging Results:  Relevant Lab Results:   Additional Information SS# [id number]  Lennart Pall, LCSW

## 2019-10-16 DIAGNOSIS — G4700 Insomnia, unspecified: Secondary | ICD-10-CM | POA: Diagnosis not present

## 2019-10-16 DIAGNOSIS — D649 Anemia, unspecified: Secondary | ICD-10-CM | POA: Diagnosis not present

## 2019-10-16 DIAGNOSIS — M810 Age-related osteoporosis without current pathological fracture: Secondary | ICD-10-CM | POA: Diagnosis not present

## 2019-10-16 DIAGNOSIS — R110 Nausea: Secondary | ICD-10-CM | POA: Diagnosis not present

## 2019-10-16 DIAGNOSIS — Z7901 Long term (current) use of anticoagulants: Secondary | ICD-10-CM | POA: Diagnosis not present

## 2019-10-16 DIAGNOSIS — K219 Gastro-esophageal reflux disease without esophagitis: Secondary | ICD-10-CM | POA: Diagnosis not present

## 2019-10-16 DIAGNOSIS — N200 Calculus of kidney: Secondary | ICD-10-CM | POA: Diagnosis not present

## 2019-10-16 DIAGNOSIS — M1712 Unilateral primary osteoarthritis, left knee: Secondary | ICD-10-CM | POA: Diagnosis not present

## 2019-10-16 DIAGNOSIS — Z9889 Other specified postprocedural states: Secondary | ICD-10-CM | POA: Diagnosis not present

## 2019-10-16 DIAGNOSIS — Z4789 Encounter for other orthopedic aftercare: Secondary | ICD-10-CM | POA: Diagnosis not present

## 2019-10-16 DIAGNOSIS — M8580 Other specified disorders of bone density and structure, unspecified site: Secondary | ICD-10-CM | POA: Diagnosis not present

## 2019-10-16 DIAGNOSIS — E039 Hypothyroidism, unspecified: Secondary | ICD-10-CM | POA: Diagnosis not present

## 2019-10-16 DIAGNOSIS — K5900 Constipation, unspecified: Secondary | ICD-10-CM | POA: Diagnosis not present

## 2019-10-16 DIAGNOSIS — Z96652 Presence of left artificial knee joint: Secondary | ICD-10-CM | POA: Diagnosis not present

## 2019-10-16 DIAGNOSIS — F5104 Psychophysiologic insomnia: Secondary | ICD-10-CM | POA: Diagnosis not present

## 2019-10-16 DIAGNOSIS — M25562 Pain in left knee: Secondary | ICD-10-CM | POA: Diagnosis not present

## 2019-10-16 DIAGNOSIS — E785 Hyperlipidemia, unspecified: Secondary | ICD-10-CM | POA: Diagnosis not present

## 2019-10-16 DIAGNOSIS — I480 Paroxysmal atrial fibrillation: Secondary | ICD-10-CM | POA: Diagnosis not present

## 2019-10-16 DIAGNOSIS — E559 Vitamin D deficiency, unspecified: Secondary | ICD-10-CM | POA: Diagnosis not present

## 2019-10-16 DIAGNOSIS — M62838 Other muscle spasm: Secondary | ICD-10-CM | POA: Diagnosis not present

## 2019-10-16 DIAGNOSIS — I341 Nonrheumatic mitral (valve) prolapse: Secondary | ICD-10-CM | POA: Diagnosis not present

## 2019-10-16 LAB — SARS CORONAVIRUS 2 (TAT 6-24 HRS): SARS Coronavirus 2: NEGATIVE

## 2019-10-16 NOTE — Progress Notes (Signed)
Physical Therapy Treatment Patient Details Name: Allison Hunt MRN: [id number] DOB: 1951/01/12 Today's Date: 10/16/2019    History of Present Illness Pt s/p L TKR and with hx of MVP, positional vertigo and Cervical CA    PT Comments    Therex program performed with assist.  OOB deferred pending imminent arrival of transport.  Follow Up Recommendations  Follow surgeon's recommendation for DC plan and follow-up therapies     Equipment Recommendations  None recommended by PT    Recommendations for Other Services       Precautions / Restrictions Precautions Precautions: Fall;Knee Restrictions Weight Bearing Restrictions: No Other Position/Activity Restrictions: WBAT    Mobility  Bed Mobility                  Transfers                    Ambulation/Gait                 Stairs             Wheelchair Mobility    Modified Rankin (Stroke Patients Only)       Balance                                            Cognition Arousal/Alertness: Awake/alert Behavior During Therapy: WFL for tasks assessed/performed Overall Cognitive Status: Within Functional Limits for tasks assessed                                        Exercises Total Joint Exercises Ankle Circles/Pumps: AROM;Both;15 reps;Supine Quad Sets: AROM;Both;Supine;20 reps Short Arc QuadSinclair Hunt;Left;10 reps;Supine Heel Slides: AAROM;Left;Supine;20 reps Hip ABduction/ADduction: AAROM;Left;20 reps;Supine Straight Leg Raises: AAROM;Left;Supine;20 reps Goniometric ROM: AAROM L knee -5 - 45 pain limited    General Comments        Pertinent Vitals/Pain Pain Assessment: 0-10 Pain Score: 4  Pain Location: L knee Pain Descriptors / Indicators: Aching;Sore Pain Intervention(s): Limited activity within patient's tolerance;Monitored during session;Premedicated before session;Ice applied    Home Living                      Prior  Function            PT Goals (current goals can now be found in the care plan section) Acute Rehab PT Goals Patient Stated Goal: Rehab and then home PT Goal Formulation: With patient Time For Goal Achievement: 10/28/19 Potential to Achieve Goals: Good Progress towards PT goals: Progressing toward goals    Frequency    7X/week      PT Plan Current plan remains appropriate    Co-evaluation              AM-PAC PT "6 Clicks" Mobility   Outcome Measure  Help needed turning from your back to your side while in a flat bed without using bedrails?: A Little Help needed moving from lying on your back to sitting on the side of a flat bed without using bedrails?: A Little Help needed moving to and from a bed to a chair (including a wheelchair)?: A Little Help needed standing up from a chair using your arms (e.g., wheelchair or bedside chair)?: A Little Help needed to walk in hospital room?: A Little Help  needed climbing 3-5 steps with a railing? : A Lot 6 Click Score: 17    End of Session Equipment Utilized During Treatment: Gait belt Activity Tolerance: Patient tolerated treatment well Patient left: in bed;with call bell/phone within reach Nurse Communication: Mobility status PT Visit Diagnosis: Unsteadiness on feet (R26.81);Difficulty in walking, not elsewhere classified (R26.2)     Time: [id number] PT Time Calculation (min) (ACUTE ONLY): 44 min  Charges:  $Therapeutic Exercise: 23-37 mins                     Debe Coder PT Acute Rehabilitation Services Pager [phone] Office [phone]    Allison Hunt 10/16/2019, 12:54 PM

## 2019-10-16 NOTE — Plan of Care (Signed)
  Problem: Activity: Goal: Risk for activity intolerance will decrease Outcome: Progressing   Problem: Elimination: Goal: Will not experience complications related to bowel motility Outcome: Progressing   Problem: Elimination: Goal: Will not experience complications related to urinary retention Outcome: Progressing   Problem: Safety: Goal: Ability to remain free from injury will improve Outcome: Progressing   Problem: Pain Management: Goal: Pain level will decrease with appropriate interventions Outcome: Progressing

## 2019-10-16 NOTE — Progress Notes (Signed)
    Patient doing well S/P L TKA by [facility] Team. Pain well controlled, pt doing well. Expecting to D/C to SNF today. She has been up and NL B/B function, eating well, w/o complaint.   Physical Exam: Vitals:   10/15/19 1323 10/15/19 2052  BP: (!) 146/73 (!) 147/63  Pulse: 75 83  Resp: 17 16  Temp: 98.8 F (37.1 C) 98.7 F (37.1 C)  SpO2: 100% 100%    Dressing in place, CDI, distal compartments soft, resting comfortably in bed NVI  POD #2 s/p Left TKA by Dr Rhona Raider, doing well  - D/C today to SNF, all meds and paperwork done and ready  - up with PT/OT, encourage ambulation  -WBAT - Continue on ASA 81mg  BID x 2 weeks post op. - Follow up in office 2 weeks post op.

## 2019-10-16 NOTE — TOC Transition Note (Addendum)
Transition of Care [facility]) - CM/SW Discharge Note   Patient Details  Name: Calais Ayars MRN: [id number] Date of Birth: 1951/06/07  [facility] CM/SW Contact:  Ross Ludwig, LCSW Phone Number: 10/16/2019, 9:46 AM   Clinical Narrative:     CSW spoke to [facility] they are requesting patient be transported via EMS to reduce the chances of Covid transmission.  Patient to be d/c'ed today to [facility].  Patient and family agreeable to plans will transport via EMS,  RN to call report [phone].  Patient's friend Butch Penny is aware of discharge for today.     Final next level of care: Skilled Nursing Facility Barriers to Discharge: Barriers Resolved   Patient Goals and CMS Choice Patient states their goals for this hospitalization and ongoing recovery are:: Patient plans to go to SNF for short term rehab, then return back home with home health. CMS Medicare.gov Compare Post Acute Care list provided to:: Patient Represenative (must comment) Choice offered to / list presented to : Adult Children  Discharge Placement PASRR number recieved: 10/15/19            Patient chooses bed at: [facility] [place] Patient to be transferred to facility by: Private vehicle Name of family member notified: Thedora Hinders, [phone] Patient and family notified of of transfer: 10/16/19  Discharge Plan and Services In-house Referral: Clinical Social Work                DME Agency: NA       HH Arranged: NA          Social Determinants of Health ([place]) Interventions     Readmission Risk Interventions No flowsheet data found.

## 2019-10-16 NOTE — Progress Notes (Signed)
Report called to Tammy RN at [facility]. No needs at this time. Rn will continue to monitor pt until EMS arrives.

## 2019-10-16 NOTE — Progress Notes (Signed)
Pt stable at time of d/c to [facility] via ems. No needs at time of transport. Pt dressing dry and intact. D/c instructions given to pt and facility.

## 2019-10-22 DIAGNOSIS — Z4789 Encounter for other orthopedic aftercare: Secondary | ICD-10-CM | POA: Diagnosis not present

## 2019-10-22 DIAGNOSIS — Z96652 Presence of left artificial knee joint: Secondary | ICD-10-CM | POA: Diagnosis not present

## 2019-10-22 DIAGNOSIS — G4700 Insomnia, unspecified: Secondary | ICD-10-CM | POA: Diagnosis not present

## 2019-10-22 DIAGNOSIS — M810 Age-related osteoporosis without current pathological fracture: Secondary | ICD-10-CM | POA: Diagnosis not present

## 2019-10-22 DIAGNOSIS — E785 Hyperlipidemia, unspecified: Secondary | ICD-10-CM | POA: Diagnosis not present

## 2019-10-22 DIAGNOSIS — K5900 Constipation, unspecified: Secondary | ICD-10-CM | POA: Diagnosis not present

## 2019-10-25 DIAGNOSIS — Z9889 Other specified postprocedural states: Secondary | ICD-10-CM | POA: Diagnosis not present

## 2019-10-25 DIAGNOSIS — Z96652 Presence of left artificial knee joint: Secondary | ICD-10-CM | POA: Diagnosis not present

## 2019-10-28 DIAGNOSIS — Z96652 Presence of left artificial knee joint: Secondary | ICD-10-CM | POA: Diagnosis not present

## 2019-10-28 DIAGNOSIS — M25662 Stiffness of left knee, not elsewhere classified: Secondary | ICD-10-CM | POA: Diagnosis not present

## 2019-11-01 DIAGNOSIS — Z96652 Presence of left artificial knee joint: Secondary | ICD-10-CM | POA: Diagnosis not present

## 2019-11-01 DIAGNOSIS — M25662 Stiffness of left knee, not elsewhere classified: Secondary | ICD-10-CM | POA: Diagnosis not present

## 2019-11-02 DIAGNOSIS — M25662 Stiffness of left knee, not elsewhere classified: Secondary | ICD-10-CM | POA: Diagnosis not present

## 2019-11-02 DIAGNOSIS — Z96652 Presence of left artificial knee joint: Secondary | ICD-10-CM | POA: Diagnosis not present

## 2019-11-05 DIAGNOSIS — M810 Age-related osteoporosis without current pathological fracture: Secondary | ICD-10-CM | POA: Diagnosis not present

## 2019-11-05 DIAGNOSIS — M25662 Stiffness of left knee, not elsewhere classified: Secondary | ICD-10-CM | POA: Diagnosis not present

## 2019-11-05 DIAGNOSIS — M1712 Unilateral primary osteoarthritis, left knee: Secondary | ICD-10-CM | POA: Diagnosis not present

## 2019-11-05 DIAGNOSIS — Z96652 Presence of left artificial knee joint: Secondary | ICD-10-CM | POA: Diagnosis not present

## 2019-11-10 DIAGNOSIS — M25662 Stiffness of left knee, not elsewhere classified: Secondary | ICD-10-CM | POA: Diagnosis not present

## 2019-11-10 DIAGNOSIS — Z96652 Presence of left artificial knee joint: Secondary | ICD-10-CM | POA: Diagnosis not present

## 2019-11-12 DIAGNOSIS — Z96652 Presence of left artificial knee joint: Secondary | ICD-10-CM | POA: Diagnosis not present

## 2019-11-12 DIAGNOSIS — M25662 Stiffness of left knee, not elsewhere classified: Secondary | ICD-10-CM | POA: Diagnosis not present

## 2019-11-15 DIAGNOSIS — Z96652 Presence of left artificial knee joint: Secondary | ICD-10-CM | POA: Diagnosis not present

## 2019-11-15 DIAGNOSIS — Z9889 Other specified postprocedural states: Secondary | ICD-10-CM | POA: Diagnosis not present

## 2019-11-15 DIAGNOSIS — M25662 Stiffness of left knee, not elsewhere classified: Secondary | ICD-10-CM | POA: Diagnosis not present

## 2019-11-15 DIAGNOSIS — M25561 Pain in right knee: Secondary | ICD-10-CM | POA: Diagnosis not present

## 2019-11-17 DIAGNOSIS — M25662 Stiffness of left knee, not elsewhere classified: Secondary | ICD-10-CM | POA: Diagnosis not present

## 2019-11-17 DIAGNOSIS — Z96652 Presence of left artificial knee joint: Secondary | ICD-10-CM | POA: Diagnosis not present

## 2019-11-22 DIAGNOSIS — M25662 Stiffness of left knee, not elsewhere classified: Secondary | ICD-10-CM | POA: Diagnosis not present

## 2019-11-22 DIAGNOSIS — Z96652 Presence of left artificial knee joint: Secondary | ICD-10-CM | POA: Diagnosis not present

## 2019-11-24 DIAGNOSIS — Z96652 Presence of left artificial knee joint: Secondary | ICD-10-CM | POA: Diagnosis not present

## 2019-11-24 DIAGNOSIS — M25662 Stiffness of left knee, not elsewhere classified: Secondary | ICD-10-CM | POA: Diagnosis not present

## 2019-11-30 DIAGNOSIS — M25662 Stiffness of left knee, not elsewhere classified: Secondary | ICD-10-CM | POA: Diagnosis not present

## 2019-11-30 DIAGNOSIS — Z96652 Presence of left artificial knee joint: Secondary | ICD-10-CM | POA: Diagnosis not present

## 2019-12-06 DIAGNOSIS — Z96652 Presence of left artificial knee joint: Secondary | ICD-10-CM | POA: Diagnosis not present

## 2019-12-06 DIAGNOSIS — M25662 Stiffness of left knee, not elsewhere classified: Secondary | ICD-10-CM | POA: Diagnosis not present

## 2019-12-10 DIAGNOSIS — Z96652 Presence of left artificial knee joint: Secondary | ICD-10-CM | POA: Diagnosis not present

## 2019-12-10 DIAGNOSIS — M25662 Stiffness of left knee, not elsewhere classified: Secondary | ICD-10-CM | POA: Diagnosis not present

## 2019-12-15 DIAGNOSIS — Z9889 Other specified postprocedural states: Secondary | ICD-10-CM | POA: Diagnosis not present

## 2019-12-17 DIAGNOSIS — Z96652 Presence of left artificial knee joint: Secondary | ICD-10-CM | POA: Diagnosis not present

## 2019-12-17 DIAGNOSIS — M25662 Stiffness of left knee, not elsewhere classified: Secondary | ICD-10-CM | POA: Diagnosis not present

## 2019-12-20 DIAGNOSIS — Z96652 Presence of left artificial knee joint: Secondary | ICD-10-CM | POA: Diagnosis not present

## 2019-12-20 DIAGNOSIS — M25662 Stiffness of left knee, not elsewhere classified: Secondary | ICD-10-CM | POA: Diagnosis not present

## 2019-12-24 DIAGNOSIS — Z96652 Presence of left artificial knee joint: Secondary | ICD-10-CM | POA: Diagnosis not present

## 2019-12-24 DIAGNOSIS — M25662 Stiffness of left knee, not elsewhere classified: Secondary | ICD-10-CM | POA: Diagnosis not present

## 2019-12-28 DIAGNOSIS — Z96652 Presence of left artificial knee joint: Secondary | ICD-10-CM | POA: Diagnosis not present

## 2019-12-28 DIAGNOSIS — M25662 Stiffness of left knee, not elsewhere classified: Secondary | ICD-10-CM | POA: Diagnosis not present

## 2019-12-30 DIAGNOSIS — M25662 Stiffness of left knee, not elsewhere classified: Secondary | ICD-10-CM | POA: Diagnosis not present

## 2019-12-30 DIAGNOSIS — Z96652 Presence of left artificial knee joint: Secondary | ICD-10-CM | POA: Diagnosis not present

## 2020-01-03 DIAGNOSIS — M25662 Stiffness of left knee, not elsewhere classified: Secondary | ICD-10-CM | POA: Diagnosis not present

## 2020-01-03 DIAGNOSIS — Z96652 Presence of left artificial knee joint: Secondary | ICD-10-CM | POA: Diagnosis not present

## 2020-01-14 DIAGNOSIS — Z9889 Other specified postprocedural states: Secondary | ICD-10-CM | POA: Diagnosis not present

## 2020-01-14 DIAGNOSIS — Z96652 Presence of left artificial knee joint: Secondary | ICD-10-CM | POA: Diagnosis not present

## 2020-01-14 DIAGNOSIS — M67911 Unspecified disorder of synovium and tendon, right shoulder: Secondary | ICD-10-CM | POA: Diagnosis not present

## 2020-01-14 DIAGNOSIS — M25662 Stiffness of left knee, not elsewhere classified: Secondary | ICD-10-CM | POA: Diagnosis not present

## 2020-01-18 DIAGNOSIS — M25662 Stiffness of left knee, not elsewhere classified: Secondary | ICD-10-CM | POA: Diagnosis not present

## 2020-01-18 DIAGNOSIS — Z96652 Presence of left artificial knee joint: Secondary | ICD-10-CM | POA: Diagnosis not present

## 2020-01-20 DIAGNOSIS — Z96652 Presence of left artificial knee joint: Secondary | ICD-10-CM | POA: Diagnosis not present

## 2020-01-20 DIAGNOSIS — M25662 Stiffness of left knee, not elsewhere classified: Secondary | ICD-10-CM | POA: Diagnosis not present

## 2020-01-24 DIAGNOSIS — Z96652 Presence of left artificial knee joint: Secondary | ICD-10-CM | POA: Diagnosis not present

## 2020-01-24 DIAGNOSIS — M25662 Stiffness of left knee, not elsewhere classified: Secondary | ICD-10-CM | POA: Diagnosis not present

## 2020-01-26 DIAGNOSIS — Z96652 Presence of left artificial knee joint: Secondary | ICD-10-CM | POA: Diagnosis not present

## 2020-01-26 DIAGNOSIS — M25662 Stiffness of left knee, not elsewhere classified: Secondary | ICD-10-CM | POA: Diagnosis not present

## 2020-02-01 DIAGNOSIS — Z96652 Presence of left artificial knee joint: Secondary | ICD-10-CM | POA: Diagnosis not present

## 2020-02-01 DIAGNOSIS — M25662 Stiffness of left knee, not elsewhere classified: Secondary | ICD-10-CM | POA: Diagnosis not present

## 2020-02-03 DIAGNOSIS — Z96652 Presence of left artificial knee joint: Secondary | ICD-10-CM | POA: Diagnosis not present

## 2020-02-03 DIAGNOSIS — M25662 Stiffness of left knee, not elsewhere classified: Secondary | ICD-10-CM | POA: Diagnosis not present

## 2020-02-08 DIAGNOSIS — M25662 Stiffness of left knee, not elsewhere classified: Secondary | ICD-10-CM | POA: Diagnosis not present

## 2020-02-08 DIAGNOSIS — Z96652 Presence of left artificial knee joint: Secondary | ICD-10-CM | POA: Diagnosis not present

## 2020-02-09 DIAGNOSIS — B351 Tinea unguium: Secondary | ICD-10-CM | POA: Diagnosis not present

## 2020-02-09 DIAGNOSIS — N060 Isolated proteinuria with minor glomerular abnormality: Secondary | ICD-10-CM | POA: Diagnosis not present

## 2020-02-09 DIAGNOSIS — M170 Bilateral primary osteoarthritis of knee: Secondary | ICD-10-CM | POA: Diagnosis not present

## 2020-02-09 DIAGNOSIS — M8580 Other specified disorders of bone density and structure, unspecified site: Secondary | ICD-10-CM | POA: Diagnosis not present

## 2020-02-09 DIAGNOSIS — G4700 Insomnia, unspecified: Secondary | ICD-10-CM | POA: Diagnosis not present

## 2020-02-10 DIAGNOSIS — M25662 Stiffness of left knee, not elsewhere classified: Secondary | ICD-10-CM | POA: Diagnosis not present

## 2020-02-10 DIAGNOSIS — Z96652 Presence of left artificial knee joint: Secondary | ICD-10-CM | POA: Diagnosis not present

## 2020-02-15 DIAGNOSIS — M25662 Stiffness of left knee, not elsewhere classified: Secondary | ICD-10-CM | POA: Diagnosis not present

## 2020-02-15 DIAGNOSIS — Z96652 Presence of left artificial knee joint: Secondary | ICD-10-CM | POA: Diagnosis not present

## 2020-02-23 DIAGNOSIS — M8589 Other specified disorders of bone density and structure, multiple sites: Secondary | ICD-10-CM | POA: Diagnosis not present

## 2020-02-23 DIAGNOSIS — Z1231 Encounter for screening mammogram for malignant neoplasm of breast: Secondary | ICD-10-CM | POA: Diagnosis not present

## 2020-03-10 DIAGNOSIS — Z96652 Presence of left artificial knee joint: Secondary | ICD-10-CM | POA: Diagnosis not present

## 2020-03-10 DIAGNOSIS — M25511 Pain in right shoulder: Secondary | ICD-10-CM | POA: Diagnosis not present

## 2020-03-14 DIAGNOSIS — Z96652 Presence of left artificial knee joint: Secondary | ICD-10-CM | POA: Diagnosis not present

## 2020-03-14 DIAGNOSIS — M6281 Muscle weakness (generalized): Secondary | ICD-10-CM | POA: Diagnosis not present

## 2020-03-16 DIAGNOSIS — Z96652 Presence of left artificial knee joint: Secondary | ICD-10-CM | POA: Diagnosis not present

## 2020-03-16 DIAGNOSIS — M6281 Muscle weakness (generalized): Secondary | ICD-10-CM | POA: Diagnosis not present

## 2020-03-21 DIAGNOSIS — M6281 Muscle weakness (generalized): Secondary | ICD-10-CM | POA: Diagnosis not present

## 2020-03-21 DIAGNOSIS — Z96652 Presence of left artificial knee joint: Secondary | ICD-10-CM | POA: Diagnosis not present

## 2020-03-23 DIAGNOSIS — Z96652 Presence of left artificial knee joint: Secondary | ICD-10-CM | POA: Diagnosis not present

## 2020-03-23 DIAGNOSIS — M6281 Muscle weakness (generalized): Secondary | ICD-10-CM | POA: Diagnosis not present

## 2020-03-28 DIAGNOSIS — Z96652 Presence of left artificial knee joint: Secondary | ICD-10-CM | POA: Diagnosis not present

## 2020-03-28 DIAGNOSIS — M6281 Muscle weakness (generalized): Secondary | ICD-10-CM | POA: Diagnosis not present

## 2020-04-04 DIAGNOSIS — M6281 Muscle weakness (generalized): Secondary | ICD-10-CM | POA: Diagnosis not present

## 2020-04-04 DIAGNOSIS — Z96652 Presence of left artificial knee joint: Secondary | ICD-10-CM | POA: Diagnosis not present

## 2020-04-06 ENCOUNTER — Other Ambulatory Visit: Payer: Self-pay

## 2020-04-06 ENCOUNTER — Encounter (INDEPENDENT_AMBULATORY_CARE_PROVIDER_SITE_OTHER): Payer: Self-pay | Admitting: Otolaryngology

## 2020-04-06 ENCOUNTER — Ambulatory Visit (INDEPENDENT_AMBULATORY_CARE_PROVIDER_SITE_OTHER): Payer: PPO | Admitting: Otolaryngology

## 2020-04-06 VITALS — Temp 97.3°F

## 2020-04-06 DIAGNOSIS — H6123 Impacted cerumen, bilateral: Secondary | ICD-10-CM | POA: Diagnosis not present

## 2020-04-06 DIAGNOSIS — Z96652 Presence of left artificial knee joint: Secondary | ICD-10-CM | POA: Diagnosis not present

## 2020-04-06 DIAGNOSIS — M6281 Muscle weakness (generalized): Secondary | ICD-10-CM | POA: Diagnosis not present

## 2020-04-06 NOTE — Progress Notes (Signed)
HPI: Allison Hunt is a 70 y.o. female who presents for evaluation of wax buildup in her ears. She was last seen in April of this year to have this cleaned..  Past Medical History:  Diagnosis Date  . Arthritis    osteoarthritis both knees  . Cardiac asystole ([place])    a.  Felt to be 2/2 high vagal tone in setting of knee surgery;  b. 09/2011 Echo: EF 60-65%.  . Cervical cancer ([place]) 1984  . 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 right knee - s/p Knee surgery 09/2011  . Complication of anesthesia    a. Vasovagal syncope with bradycardia and asystole requiring brief chest compression 02/07/12 & hypotension  . Dental crowns present   . Gallbladder polyp   . GERD (gastroesophageal reflux disease)   . 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 History of kidney stones   . Hypercholesterolemia   . Insomnia   . Lateral meniscal tear 09/2011   a. right knee  . Liver hemangioma   . Mechanical ptosis of bilateral eyelids    upper eyelids  . Medial meniscus tear 09/2011   a. right knee  . Mitral valve prolapse   . Myogenic ptosis    left upper eyelid  . Paroxysmal atrial fibrillation ([place])    a. 09/2011 in setting of post-op knee  . PONV (postoperative nausea and vomiting)    flat line after prophol, hx of bp droppin also  . Positional vertigo   . Shortness of breath   . Syncope, vasovagal 2005   a. exacerbated by pain/surgical procedures   Past Surgical History:  Procedure Laterality Date  . BROW LIFT Bilateral 04/16/2018   Procedure: BLEPHAROPLASTY BOTH UPPER LIDS;  Surgeon: [name], MD;  Location: [place];  Service: Ophthalmology;  Laterality: Bilateral;  . COLONOSCOPY WITH PROPOFOL N/A 02/19/2017   Procedure: COLONOSCOPY WITH PROPOFOL;  Surgeon: Arta Silence, MD;  [facility];  Service: Endoscopy;  Laterality: N/A;  . DILATION AND CURETTAGE OF UTERUS    . KIDNEY STONE SURGERY     denies  . KNEE  ARTHROSCOPY  10/08/2011   Procedure: ARTHROSCOPY KNEE;  Surgeon: [name], MD;  Location: [place];  Service: Orthopedics;  Laterality: Right;  right knee arthroscopy partial medial and lateral menisectomy and chondroplasty  . KNEE ARTHROSCOPY Left 01/04/2014   Procedure: ARTHROSCOPY KNEE;  Surgeon: [name], MD;  Location: [place];  Service: Orthopedics;  Laterality: Left;  Patient flatlined at outpatient surgery center when she had same surgery a year ago ended up in ICU for two days has had cardiac issues since 2013.  Marland Kitchen PATENT DUCTUS ARTERIOUS REPAIR  1956  . PTOSIS REPAIR Left 04/16/2018   Procedure: PTOSIS REPAIR LEFT EYE;  Surgeon: [name], MD;  Location: [place];  Service: Ophthalmology;  Laterality: Left;  . TOTAL ABDOMINAL HYSTERECTOMY  1991   due to cervical cancer  . TOTAL KNEE ARTHROPLASTY Left 10/14/2019   Procedure: LEFT TOTAL KNEE ARTHROPLASTY;  Surgeon: Melrose Nakayama, MD;  [facility];  Service: Orthopedics;  Laterality: Left;  . TUBAL LIGATION     Social History   Socioeconomic History  . Marital status: Single    Spouse name: Not on file  . Number of children: 1  . Years of education: 51  . Highest education level: Not on file  Occupational History  . Occupation:  Flight attendant    Employer: [employer]  Tobacco Use  . Smoking status: Former Smoker    Packs/day: 1.00    Years: 23.00    Pack years: 23.00    Start date: 1968  . Smokeless tobacco: Never Used  . Tobacco comment: quit smoking cigarettes in 1991  Vaping Use  . Vaping Use: Never used  Substance and Sexual Activity  . Alcohol use: Yes    Comment: 1 daily glass of wine  . Drug use: No  . Sexual activity: Not on file  Other Topics Concern  . Not on file  Social History Narrative       Social Determinants of Health   Financial Resource Strain:   . Difficulty of Paying Living Expenses: Not on file  Food Insecurity:   . Worried About Ship broker in the Last Year: Not on file  . Ran Out of Food in the Last Year: Not on file  Transportation Needs:   . Lack of Transportation (Medical): Not on file  . Lack of Transportation (Non-Medical): Not on file  Physical Activity:   . Days of Exercise per Week: Not on file  . Minutes of Exercise per Session: Not on file  Stress:   . Feeling of Stress : Not on file  Social Connections:   . Frequency of Communication with Friends and Family: Not on file  . Frequency of Social Gatherings with Friends and Family: Not on file  . Attends Religious Services: Not on file  . Active Member of Clubs or Organizations: Not on file  . Attends Archivist Meetings: Not on file  . Marital Status: Not on file   Family History  Problem Relation Age of Onset  . Anemia Father   . Heart attack Father   . Pulmonary embolism Father   . Colon cancer Father   . Alzheimer's disease Mother   . Lupus Sister   . Aortic stenosis Son        with AVR at 52 y/o  . Suicidality Paternal Grandfather   . Dementia Paternal Grandmother   . Colon cancer Maternal Grandmother    Allergies  Allergen Reactions  . Propofol Anaphylaxis and Other (See Comments)    Pt "flat lined" twice when she had propofol administered to her during another surgery  . Demerol Nausea And Vomiting  . Tramadol Nausea And Vomiting and Other (See Comments)    Pt must take with food to tolerate medication   Prior to Admission medications   Medication Sig Start Date End Date Taking? Authorizing Provider  acetaminophen (TYLENOL) 500 MG tablet Take 500 mg by mouth every 6 (six) hours as needed for moderate pain.   Yes [provider]  aspirin 81 MG chewable tablet Chew 1 tablet (81 mg total) by mouth 2 (two) times daily. 10/15/19  Yes Loni Dolly, PA-C  atorvastatin (LIPITOR) 10 MG tablet Take 10 mg by mouth daily.  12/03/13  Yes [provider]  Cholecalciferol (VITAMIN D) 50 MCG (2000 UT) CAPS Take 2,000 Units by  mouth daily.   Yes [provider]  HYDROcodone-acetaminophen (NORCO/VICODIN) 5-325 MG tablet Take 1-2 tablets by mouth every 6 (six) hours as needed for moderate pain (post op pain). 10/15/19  Yes Loni Dolly, PA-C  ondansetron (ZOFRAN) 4 MG tablet Take 1 tablet (4 mg total) by mouth every 6 (six) hours as needed for nausea. 10/15/19  Yes Loni Dolly, PA-C  PREVIDENT 0.2 % SOLN Take 15 mLs  by mouth at bedtime.  11/17/13  Yes [provider]  raloxifene (EVISTA) 60 MG tablet Take 60 mg by mouth daily. 01/30/18  Yes [provider]  tiZANidine (ZANAFLEX) 4 MG tablet Take 1 tablet (4 mg total) by mouth every 6 (six) hours as needed for muscle spasms. 10/15/19 10/14/20 Yes Loni Dolly, PA-C  zaleplon (SONATA) 10 MG capsule Take 10 mg by mouth at bedtime.    Yes [provider]     Positive ROS: Otherwise negative  All other systems have been reviewed and were otherwise negative with the exception of those mentioned in the HPI and as above.  Physical Exam: Constitutional: Alert, well-appearing, no acute distress Ears: External ears without lesions or tenderness. Ear canals very narrowed left ear canal. Ear canals were cleaned with suction and curettes. TMs were clear bilaterally. Nasal: External nose without lesions. Clear nasal passages Oral: Oropharynx clear. Neck: No palpable adenopathy or masses Respiratory: Breathing comfortably  Skin: No facial/neck lesions or rash noted.  Cerumen impaction removal  Date/Time: 04/06/2020 4:47 PM Performed by: Rozetta Nunnery, MD Authorized by: Rozetta Nunnery, MD   Consent:    Consent obtained:  Verbal   Consent given by:  Patient   Risks discussed:  Pain and bleeding Procedure details:    Location:  L ear and R ear   Procedure type: curette and suction   Post-procedure details:    Inspection:  TM intact and canal normal   Hearing quality:  Improved   Patient tolerance of procedure:  Tolerated well, no  immediate complications Comments:     Left ear canal is very narrowed that had to be opened with a nasal speculum. Ear canals were cleaned and TMs were clear otherwise    Assessment: Cerumen buildup bilaterally with a very narrowed left ear canal  Plan: Ear canals were cleaned. Patient will follow up as needed  Radene Journey, MD

## 2020-04-11 DIAGNOSIS — M6281 Muscle weakness (generalized): Secondary | ICD-10-CM | POA: Diagnosis not present

## 2020-04-11 DIAGNOSIS — Z96652 Presence of left artificial knee joint: Secondary | ICD-10-CM | POA: Diagnosis not present

## 2020-04-14 DIAGNOSIS — Z96652 Presence of left artificial knee joint: Secondary | ICD-10-CM | POA: Diagnosis not present

## 2020-04-14 DIAGNOSIS — T8489XA Other specified complication of internal orthopedic prosthetic devices, implants and grafts, initial encounter: Secondary | ICD-10-CM | POA: Diagnosis not present

## 2020-04-26 DIAGNOSIS — M7541 Impingement syndrome of right shoulder: Secondary | ICD-10-CM | POA: Diagnosis not present

## 2020-04-26 DIAGNOSIS — M25362 Other instability, left knee: Secondary | ICD-10-CM | POA: Diagnosis not present

## 2020-06-19 ENCOUNTER — Other Ambulatory Visit: Payer: Self-pay | Admitting: Orthopaedic Surgery

## 2020-06-19 DIAGNOSIS — Z01818 Encounter for other preprocedural examination: Secondary | ICD-10-CM

## 2020-06-21 DIAGNOSIS — E785 Hyperlipidemia, unspecified: Secondary | ICD-10-CM | POA: Diagnosis not present

## 2020-06-21 DIAGNOSIS — G8324 Monoplegia of upper limb affecting left nondominant side: Secondary | ICD-10-CM | POA: Diagnosis not present

## 2020-06-21 DIAGNOSIS — R29701 NIHSS score 1: Secondary | ICD-10-CM | POA: Diagnosis not present

## 2020-06-21 DIAGNOSIS — I639 Cerebral infarction, unspecified: Secondary | ICD-10-CM | POA: Diagnosis not present

## 2020-06-21 DIAGNOSIS — R471 Dysarthria and anarthria: Secondary | ICD-10-CM | POA: Diagnosis not present

## 2020-06-21 DIAGNOSIS — N3000 Acute cystitis without hematuria: Secondary | ICD-10-CM | POA: Diagnosis not present

## 2020-06-21 DIAGNOSIS — R4781 Slurred speech: Secondary | ICD-10-CM | POA: Diagnosis not present

## 2020-06-21 DIAGNOSIS — R531 Weakness: Secondary | ICD-10-CM | POA: Diagnosis not present

## 2020-06-21 DIAGNOSIS — I10 Essential (primary) hypertension: Secondary | ICD-10-CM | POA: Diagnosis not present

## 2020-06-21 DIAGNOSIS — I081 Rheumatic disorders of both mitral and tricuspid valves: Secondary | ICD-10-CM | POA: Diagnosis not present

## 2020-06-21 DIAGNOSIS — I63311 Cerebral infarction due to thrombosis of right middle cerebral artery: Secondary | ICD-10-CM | POA: Diagnosis not present

## 2020-06-21 DIAGNOSIS — G8194 Hemiplegia, unspecified affecting left nondominant side: Secondary | ICD-10-CM | POA: Diagnosis not present

## 2020-06-21 DIAGNOSIS — R29810 Facial weakness: Secondary | ICD-10-CM | POA: Diagnosis not present

## 2020-07-03 DIAGNOSIS — Z7902 Long term (current) use of antithrombotics/antiplatelets: Secondary | ICD-10-CM | POA: Diagnosis not present

## 2020-07-03 DIAGNOSIS — E7849 Other hyperlipidemia: Secondary | ICD-10-CM | POA: Diagnosis not present

## 2020-07-03 DIAGNOSIS — I69354 Hemiplegia and hemiparesis following cerebral infarction affecting left non-dominant side: Secondary | ICD-10-CM | POA: Diagnosis not present

## 2020-07-03 DIAGNOSIS — I10 Essential (primary) hypertension: Secondary | ICD-10-CM | POA: Diagnosis not present

## 2020-07-03 DIAGNOSIS — N3000 Acute cystitis without hematuria: Secondary | ICD-10-CM | POA: Diagnosis not present

## 2020-07-03 DIAGNOSIS — Z7982 Long term (current) use of aspirin: Secondary | ICD-10-CM | POA: Diagnosis not present

## 2020-07-04 DIAGNOSIS — E7849 Other hyperlipidemia: Secondary | ICD-10-CM | POA: Diagnosis not present

## 2020-07-04 DIAGNOSIS — Z8673 Personal history of transient ischemic attack (TIA), and cerebral infarction without residual deficits: Secondary | ICD-10-CM | POA: Diagnosis not present

## 2020-07-04 DIAGNOSIS — I10 Essential (primary) hypertension: Secondary | ICD-10-CM | POA: Diagnosis not present

## 2020-07-05 DIAGNOSIS — N060 Isolated proteinuria with minor glomerular abnormality: Secondary | ICD-10-CM | POA: Diagnosis not present

## 2020-07-05 DIAGNOSIS — I10 Essential (primary) hypertension: Secondary | ICD-10-CM | POA: Diagnosis not present

## 2020-07-05 DIAGNOSIS — F5101 Primary insomnia: Secondary | ICD-10-CM | POA: Diagnosis not present

## 2020-07-05 DIAGNOSIS — M4124 Other idiopathic scoliosis, thoracic region: Secondary | ICD-10-CM | POA: Diagnosis not present

## 2020-07-05 DIAGNOSIS — M8580 Other specified disorders of bone density and structure, unspecified site: Secondary | ICD-10-CM | POA: Diagnosis not present

## 2020-07-05 DIAGNOSIS — M170 Bilateral primary osteoarthritis of knee: Secondary | ICD-10-CM | POA: Diagnosis not present

## 2020-07-05 DIAGNOSIS — M545 Low back pain, unspecified: Secondary | ICD-10-CM | POA: Diagnosis not present

## 2020-07-05 DIAGNOSIS — R531 Weakness: Secondary | ICD-10-CM | POA: Diagnosis not present

## 2020-07-05 DIAGNOSIS — E782 Mixed hyperlipidemia: Secondary | ICD-10-CM | POA: Diagnosis not present

## 2020-07-05 DIAGNOSIS — Z79899 Other long term (current) drug therapy: Secondary | ICD-10-CM | POA: Diagnosis not present

## 2020-07-05 DIAGNOSIS — G8929 Other chronic pain: Secondary | ICD-10-CM | POA: Diagnosis not present

## 2020-07-05 DIAGNOSIS — R350 Frequency of micturition: Secondary | ICD-10-CM | POA: Diagnosis not present

## 2020-07-09 DIAGNOSIS — I69354 Hemiplegia and hemiparesis following cerebral infarction affecting left non-dominant side: Secondary | ICD-10-CM | POA: Diagnosis not present

## 2020-07-09 DIAGNOSIS — Z7982 Long term (current) use of aspirin: Secondary | ICD-10-CM | POA: Diagnosis not present

## 2020-07-09 DIAGNOSIS — N3000 Acute cystitis without hematuria: Secondary | ICD-10-CM | POA: Diagnosis not present

## 2020-07-09 DIAGNOSIS — E7849 Other hyperlipidemia: Secondary | ICD-10-CM | POA: Diagnosis not present

## 2020-07-09 DIAGNOSIS — I10 Essential (primary) hypertension: Secondary | ICD-10-CM | POA: Diagnosis not present

## 2020-07-09 DIAGNOSIS — Z7902 Long term (current) use of antithrombotics/antiplatelets: Secondary | ICD-10-CM | POA: Diagnosis not present

## 2020-07-11 DIAGNOSIS — E7849 Other hyperlipidemia: Secondary | ICD-10-CM | POA: Diagnosis not present

## 2020-07-11 DIAGNOSIS — I10 Essential (primary) hypertension: Secondary | ICD-10-CM | POA: Diagnosis not present

## 2020-07-11 DIAGNOSIS — Z7902 Long term (current) use of antithrombotics/antiplatelets: Secondary | ICD-10-CM | POA: Diagnosis not present

## 2020-07-11 DIAGNOSIS — Z7982 Long term (current) use of aspirin: Secondary | ICD-10-CM | POA: Diagnosis not present

## 2020-07-11 DIAGNOSIS — N3000 Acute cystitis without hematuria: Secondary | ICD-10-CM | POA: Diagnosis not present

## 2020-07-11 DIAGNOSIS — I69354 Hemiplegia and hemiparesis following cerebral infarction affecting left non-dominant side: Secondary | ICD-10-CM | POA: Diagnosis not present

## 2020-07-14 ENCOUNTER — Encounter (HOSPITAL_COMMUNITY): Payer: PPO

## 2020-07-14 ENCOUNTER — Other Ambulatory Visit (HOSPITAL_COMMUNITY): Payer: PPO

## 2020-07-18 ENCOUNTER — Inpatient Hospital Stay: Admit: 2020-07-18 | Payer: PPO | Admitting: Orthopaedic Surgery

## 2020-07-18 SURGERY — TOTAL KNEE REVISION
Anesthesia: Spinal | Site: Knee | Laterality: Left

## 2020-08-01 DIAGNOSIS — M170 Bilateral primary osteoarthritis of knee: Secondary | ICD-10-CM | POA: Diagnosis not present

## 2020-08-01 DIAGNOSIS — M4124 Other idiopathic scoliosis, thoracic region: Secondary | ICD-10-CM | POA: Diagnosis not present

## 2020-08-01 DIAGNOSIS — F5101 Primary insomnia: Secondary | ICD-10-CM | POA: Diagnosis not present

## 2020-08-01 DIAGNOSIS — G8929 Other chronic pain: Secondary | ICD-10-CM | POA: Diagnosis not present

## 2020-08-01 DIAGNOSIS — E782 Mixed hyperlipidemia: Secondary | ICD-10-CM | POA: Diagnosis not present

## 2020-08-01 DIAGNOSIS — I10 Essential (primary) hypertension: Secondary | ICD-10-CM | POA: Diagnosis not present

## 2020-08-01 DIAGNOSIS — M8580 Other specified disorders of bone density and structure, unspecified site: Secondary | ICD-10-CM | POA: Diagnosis not present

## 2020-08-01 DIAGNOSIS — M545 Low back pain, unspecified: Secondary | ICD-10-CM | POA: Diagnosis not present

## 2020-08-01 DIAGNOSIS — R634 Abnormal weight loss: Secondary | ICD-10-CM | POA: Diagnosis not present

## 2020-08-01 DIAGNOSIS — R1031 Right lower quadrant pain: Secondary | ICD-10-CM | POA: Diagnosis not present

## 2020-08-01 DIAGNOSIS — R531 Weakness: Secondary | ICD-10-CM | POA: Diagnosis not present

## 2020-08-16 ENCOUNTER — Other Ambulatory Visit: Payer: Self-pay | Admitting: Orthopaedic Surgery

## 2020-08-16 DIAGNOSIS — Z01818 Encounter for other preprocedural examination: Secondary | ICD-10-CM

## 2020-08-28 ENCOUNTER — Encounter (HOSPITAL_COMMUNITY): Payer: Self-pay

## 2020-08-28 NOTE — Progress Notes (Addendum)
PCP - C Baruch Goldmann, PA Cardiologist - Dr. Forde Dandy has appointment 3-25 22 to be cleared for surgery Neurologist- Dr. Lynnda Child sees 4-1- 22 for clearance  PPM/ICD -  Device Orders -  Rep Notified -   Chest x-ray - 10-07-19 epic EKG -06-22-20  Requested 3-24  ON chart Stress Test -  ECHO - 06-22-20  Care everywhere Cardiac Cath -   Sleep Study -  CPAP -   Fasting Blood Sugar -  Checks Blood Sugar _____ times a day  Blood Thinner Instructions: Aspirin Instructions:  81mg  ASA  ERAS Protcol - PRE-SURGERY Ensure    COVID TEST- 09-08-20  Activity--Able to walk to mailbox without SOB  Anesthesia review: HTN, CVA 1-12 22 Left hand mild  weakness  Patient denies shortness of breath, fever, cough and chest pain at PAT appointment  NONE   All instructions explained to the patient, with a verbal understanding of the material. Patient agrees to go over the instructions while at home for a better understanding. Patient also instructed to self quarantine after being tested for COVID-19. The opportunity to ask questions was provided.

## 2020-08-28 NOTE — Patient Instructions (Addendum)
DUE TO COVID-19 ONLY ONE VISITOR IS ALLOWED TO COME WITH YOU AND STAY IN THE WAITING ROOM ONLY DURING PRE OP AND PROCEDURE DAY OF SURGERY.   TWO  VISITOR  MAY VISIT WITH YOU AFTER SURGERY IN YOUR PRIVATE ROOM DURING VISITING HOURS ONLY!  YOU NEED TO HAVE A COVID 19 TEST ON___4-1-22____ @_______ , THIS TEST MUST BE DONE BEFORE SURGERY,  COVID TESTING SITE [address] [place] [address], IT IS ON THE RIGHT GOING OUT [address] APPROXIMATELY  2 MINUTES PAST [employer] ON THE RIGHT. ONCE YOUR COVID TEST IS COMPLETED,  PLEASE BEGIN THE QUARANTINE INSTRUCTIONS AS OUTLINED IN YOUR HANDOUT.                Allison Hunt  08/28/2020   Your procedure is scheduled on: 09-12-20   Report to [facility] Main  Entrance   Report to admitting at          0930  AM     Call this number if you have problems the morning of surgery [phone]    Remember: NO SOLID FOOD AFTER MIDNIGHT THE NIGHT PRIOR TO SURGERY. NOTHING BY MOUTH EXCEPT CLEAR LIQUIDS UNTIL    0900 am .   PLEASE FINISH ENSURE DRINK PER SURGEON ORDER  WHICH NEEDS TO BE COMPLETED AT      0900 am then nothing by mouth .Marland Kitchen     CLEAR LIQUID DIET   Foods Allowed                                                                   Black Coffee and tea, regular and decaf                             Plain Jell-O any favor except red or purple                                            Fruit ices (not with fruit pulp)                                    Iced Popsicles                                     Carbonated beverages, regular and diet                                    Cranberry, grape and apple juices Sports drinks like Gatorade Lightly seasoned clear broth or consume(fat free) Sugar, honey syrup  __________    BRUSH YOUR TEETH MORNING OF SURGERY AND RINSE YOUR MOUTH OUT, NO CHEWING GUM CANDY OR MINTS.     Take these medicines the morning of surgery with A SIP OF WATER  :none   DO NOT TAKE ANY  DIABETIC MEDICATIONS DAY OF YOUR SURGERY  You may not have any metal on your body including hair pins and              piercings  Do not wear jewelry, make-up, lotions, powders or perfumes, deodorant             Do not wear nail polish on your fingernails.  Do not shave  48 hours prior to surgery.     Do not bring valuables to the hospital. [name]  Contacts, dentures or bridgework may not be worn into surgery.       Patients discharged the day of surgery will not be allowed to drive home. IF YOU ARE HAVING SURGERY AND GOING HOME THE SAME DAY, YOU MUST HAVE AN ADULT TO DRIVE YOU HOME AND BE WITH YOU FOR 24 HOURS. YOU MAY GO HOME BY TAXI OR UBER OR ORTHERWISE, BUT AN ADULT MUST ACCOMPANY YOU HOME AND STAY WITH YOU FOR 24 HOURS.  Name and phone number of your driver:  Special Instructions: N/A              Please read over the following fact sheets you were given: _____________________________________________________________________             [facility] - Preparing for Surgery Before surgery, you can play an important role.  Because skin is not sterile, your skin needs to be as free of germs as possible.  You can reduce the number of germs on your skin by washing with CHG (chlorahexidine gluconate) soap before surgery.  CHG is an antiseptic cleaner which kills germs and bonds with the skin to continue killing germs even after washing. Please DO NOT use if you have an allergy to CHG or antibacterial soaps.  If your skin becomes reddened/irritated stop using the CHG and inform your nurse when you arrive at Short Stay. Do not shave (including legs and underarms) for at least 48 hours prior to the first CHG shower.  You may shave your face/neck. Please follow these instructions carefully:  1.  Shower with CHG Soap the night before surgery and the  morning of Surgery.  2.  If you choose to wash your hair, wash  your hair first as usual with your  normal  shampoo.  3.  After you shampoo, rinse your hair and body thoroughly to remove the  shampoo.                           4.  Use CHG as you would any other liquid soap.  You can apply chg directly  to the skin and wash                       Gently with a scrungie or clean washcloth.  5.  Apply the CHG Soap to your body ONLY FROM THE NECK DOWN.   Do not use on face/ open                           Wound or open sores. Avoid contact with eyes, ears mouth and genitals (private parts).                       Wash face,  Genitals (private parts) with your normal soap.  6.  Wash thoroughly, paying special attention to the area where your surgery  will be performed.  7.  Thoroughly rinse your body with warm water from the neck down.  8.  DO NOT shower/wash with your normal soap after using and rinsing off  the CHG Soap.                9.  Pat yourself dry with a clean towel.            10.  Wear clean pajamas.            11.  Place clean sheets on your bed the night of your first shower and do not  sleep with pets. Day of Surgery : Do not apply any lotions/deodorants the morning of surgery.  Please wear clean clothes to the hospital/surgery center.  FAILURE TO FOLLOW THESE INSTRUCTIONS MAY RESULT IN THE CANCELLATION OF YOUR SURGERY PATIENT SIGNATURE_________________________________  NURSE SIGNATURE__________________________________  ________________________________________________________________________   Allison Hunt  An incentive spirometer is a tool that can help keep your lungs clear and active. This tool measures how well you are filling your lungs with each breath. Taking long deep breaths may help reverse or decrease the chance of developing breathing (pulmonary) problems (especially infection) following:  A long period of time when you are unable to move or be active. BEFORE THE PROCEDURE   If the spirometer includes an  indicator to show your best effort, your nurse or respiratory therapist will set it to a desired goal.  If possible, sit up straight or lean slightly forward. Try not to slouch.  Hold the incentive spirometer in an upright position. INSTRUCTIONS FOR USE  1. Sit on the edge of your bed if possible, or sit up as far as you can in bed or on a chair. 2. Hold the incentive spirometer in an upright position. 3. Breathe out normally. 4. Place the mouthpiece in your mouth and seal your lips tightly around it. 5. Breathe in slowly and as deeply as possible, raising the piston or the ball toward the top of the column. 6. Hold your breath for 3-5 seconds or for as long as possible. Allow the piston or ball to fall to the bottom of the column. 7. Remove the mouthpiece from your mouth and breathe out normally. 8. Rest for a few seconds and repeat Steps 1 through 7 at least 10 times every 1-2 hours when you are awake. Take your time and take a few normal breaths between deep breaths. 9. The spirometer may include an indicator to show your best effort. Use the indicator as a goal to work toward during each repetition. 10. After each set of 10 deep breaths, practice coughing to be sure your lungs are clear. If you have an incision (the cut made at the time of surgery), support your incision when coughing by placing a pillow or rolled up towels firmly against it. Once you are able to get out of bed, walk around indoors and cough well. You may stop using the incentive spirometer when instructed by your caregiver.  RISKS AND COMPLICATIONS  Take your time so you do not get dizzy or light-headed.  If you are in pain, you may need to take or ask for pain medication before doing incentive spirometry. It is harder to take a deep breath if you are having pain. AFTER USE  Rest and breathe slowly and easily.  It can be helpful to keep track of a log of your  progress. Your caregiver can provide you with a simple table  to help with this. If you are using the spirometer at home, follow these instructions: [place] IF:   You are having difficultly using the spirometer.  You have trouble using the spirometer as often as instructed.  Your pain medication is not giving enough relief while using the spirometer.  You develop fever of 100.5 F (38.1 C) or higher. SEEK IMMEDIATE MEDICAL CARE IF:   You cough up bloody sputum that had not been present before.  You develop fever of 102 F (38.9 C) or greater.  You develop worsening pain at or near the incision site. MAKE SURE YOU:   Understand these instructions.  Will watch your condition.  Will get help right away if you are not doing well or get worse. Document Released: 10/07/2006 Document Revised: 08/19/2011 Document Reviewed: 12/08/2006 [facility] Patient Information 2014 [place], [place].   ________________________________________________________________________

## 2020-08-31 ENCOUNTER — Encounter (HOSPITAL_COMMUNITY)
Admission: RE | Admit: 2020-08-31 | Discharge: 2020-08-31 | Disposition: A | Discharge status: Home / Self Care | Payer: PPO | Source: Ambulatory Visit | Attending: Orthopaedic Surgery | Admitting: Orthopaedic Surgery

## 2020-08-31 ENCOUNTER — Other Ambulatory Visit: Payer: Self-pay

## 2020-08-31 ENCOUNTER — Ambulatory Visit (HOSPITAL_COMMUNITY)
Admission: RE | Admit: 2020-08-31 | Discharge: 2020-08-31 | Disposition: A | Discharge status: Home / Self Care | Payer: PPO | Source: Ambulatory Visit | Attending: Orthopaedic Surgery | Admitting: Orthopaedic Surgery

## 2020-08-31 ENCOUNTER — Encounter (HOSPITAL_COMMUNITY): Payer: Self-pay

## 2020-08-31 DIAGNOSIS — X58XXXA Exposure to other specified factors, initial encounter: Secondary | ICD-10-CM | POA: Insufficient documentation

## 2020-08-31 DIAGNOSIS — T8484XA Pain due to internal orthopedic prosthetic devices, implants and grafts, initial encounter: Secondary | ICD-10-CM | POA: Diagnosis not present

## 2020-08-31 DIAGNOSIS — K219 Gastro-esophageal reflux disease without esophagitis: Secondary | ICD-10-CM | POA: Insufficient documentation

## 2020-08-31 DIAGNOSIS — Z79899 Other long term (current) drug therapy: Secondary | ICD-10-CM | POA: Insufficient documentation

## 2020-08-31 DIAGNOSIS — Z01818 Encounter for other preprocedural examination: Secondary | ICD-10-CM | POA: Insufficient documentation

## 2020-08-31 DIAGNOSIS — Z87891 Personal history of nicotine dependence: Secondary | ICD-10-CM | POA: Diagnosis not present

## 2020-08-31 DIAGNOSIS — I480 Paroxysmal atrial fibrillation: Secondary | ICD-10-CM | POA: Diagnosis not present

## 2020-08-31 DIAGNOSIS — Z7982 Long term (current) use of aspirin: Secondary | ICD-10-CM | POA: Insufficient documentation

## 2020-08-31 DIAGNOSIS — Z96652 Presence of left artificial knee joint: Secondary | ICD-10-CM | POA: Insufficient documentation

## 2020-08-31 HISTORY — DX: Cerebral infarction, unspecified: I63.9

## 2020-08-31 LAB — URINALYSIS, ROUTINE W REFLEX MICROSCOPIC
Bilirubin Urine: NEGATIVE
Glucose, UA: NEGATIVE mg/dL
Hgb urine dipstick: NEGATIVE
Ketones, ur: 5 mg/dL — AB
Leukocytes,Ua: NEGATIVE
Nitrite: NEGATIVE
Protein, ur: NEGATIVE mg/dL
Specific Gravity, Urine: 1.023 (ref 1.005–1.030)
pH: 5 (ref 5.0–8.0)

## 2020-08-31 LAB — BASIC METABOLIC PANEL
Anion gap: 7 (ref 5–15)
BUN: 21 mg/dL (ref 8–23)
CO2: 28 mmol/L (ref 22–32)
Calcium: 9.1 mg/dL (ref 8.9–10.3)
Chloride: 104 mmol/L (ref 98–111)
Creatinine, Ser: 0.54 mg/dL (ref 0.44–1.00)
GFR, Estimated: >60 mL/min (ref >60)
Glucose, Bld: 97 mg/dL (ref 70–99)
Potassium: 3.6 mmol/L (ref 3.5–5.1)
Sodium: 139 mmol/L (ref 135–145)

## 2020-08-31 LAB — CBC WITH DIFFERENTIAL/PLATELET
Abs Immature Granulocytes: 0.02 10*3/uL (ref 0.00–0.07)
Basophils Absolute: 0 10*3/uL (ref 0.0–0.1)
Basophils Relative: 1 %
Eosinophils Absolute: 0.1 10*3/uL (ref 0.0–0.5)
Eosinophils Relative: 2 %
HCT: 40.3 % (ref 36.0–46.0)
Hemoglobin: 13.2 g/dL (ref 12.0–15.0)
Immature Granulocytes: 0 %
Lymphocytes Relative: 14 %
Lymphs Abs: 1 10*3/uL (ref 0.7–4.0)
MCH: 30.1 pg (ref 26.0–34.0)
MCHC: 32.8 g/dL (ref 30.0–36.0)
MCV: 91.8 fL (ref 80.0–100.0)
Monocytes Absolute: 0.4 10*3/uL (ref 0.1–1.0)
Monocytes Relative: 6 %
Neutro Abs: 5.6 10*3/uL (ref 1.7–7.7)
Neutrophils Relative %: 77 %
Platelets: 336 10*3/uL (ref 150–400)
RBC: 4.39 MIL/uL (ref 3.87–5.11)
RDW: 13.1 % (ref 11.5–15.5)
WBC: 7.2 10*3/uL (ref 4.0–10.5)
nRBC: 0 % (ref 0.0–0.2)

## 2020-08-31 LAB — PROTIME-INR
INR: 1 (ref 0.8–1.2)
Prothrombin Time: 13 seconds (ref 11.4–15.2)

## 2020-08-31 LAB — TYPE AND SCREEN
ABO/RH(D): A POS
Antibody Screen: NEGATIVE

## 2020-08-31 LAB — SURGICAL PCR SCREEN
MRSA, PCR: NEGATIVE
Staphylococcus aureus: NEGATIVE

## 2020-08-31 LAB — APTT: aPTT: 32 seconds (ref 24–36)

## 2020-09-01 DIAGNOSIS — R001 Bradycardia, unspecified: Secondary | ICD-10-CM | POA: Diagnosis not present

## 2020-09-01 DIAGNOSIS — Z8774 Personal history of (corrected) congenital malformations of heart and circulatory system: Secondary | ICD-10-CM | POA: Diagnosis not present

## 2020-09-01 DIAGNOSIS — E782 Mixed hyperlipidemia: Secondary | ICD-10-CM | POA: Diagnosis not present

## 2020-09-01 DIAGNOSIS — Z01810 Encounter for preprocedural cardiovascular examination: Secondary | ICD-10-CM | POA: Diagnosis not present

## 2020-09-01 DIAGNOSIS — Z8673 Personal history of transient ischemic attack (TIA), and cerebral infarction without residual deficits: Secondary | ICD-10-CM | POA: Diagnosis not present

## 2020-09-04 NOTE — Progress Notes (Signed)
Anesthesia Chart Review   Case: [id number] Date/Time: 09/12/20 0715   Procedure: LEFT TOTAL KNEE REVISION (Left Knee) - 3E BED; INS REQUIRES SURGERY ADMIT STATUS BUT PROVIDER WILL DISCHARGE WITHIN 24HRS   Anesthesia type: Spinal   Pre-op diagnosis: PAINFUL LEFT KNEE REPLACEMENT   [facility]   Surgeons: Melrose Nakayama, MD      DISCUSSION:70 y.o. former smoker with h/o PONV, GERD, PAF, vagal autonomic bradycardia, s/p repair of patent ductus arteriosus 1956, Stroke 06/21/2020, painful left knee replacement scheduled for above procedure 09/12/2020 with Dr. Melrose Nakayama.   S/p stroke 06/21/2020, determined underlying etiology of the stroke was due to small vessel disease. Per neurology notes discontinued Plavix on 07/14/2020.  Per PCP and neurology pt to wait 3 months post stroke before knee surgery.   Pt last seen by cardiology 09/01/2020 for preoperative evaluation.  Per OV note, "Asystole following knee surgery 2013 felt to be due to increased vagal tone related to post-operative n/v. No further issues. No further w/u indicated. Recommend prophylaxis/treatment of post-op n/v. If she has problems, treat with atropine and transcutaneous pacing as needed and call cardiology consult. I felt her cardiac risk for knee surgery was low, approximately 3.9%."  Anticipate pt can proceed with planned procedure barring acute status change.   VS: BP (!) 161/80   Pulse 75   Temp 36.8 C (Oral)   Resp 16   Ht 5\' 2"  (1.575 m)   Wt 66.7 kg   SpO2 98%   BMI 26.89 kg/m   PROVIDERS: Baruch Goldmann, PA-C is PCP   Forde Dandy, MD is Cardiologist  LABS: Labs reviewed: Acceptable for surgery. (all labs ordered are listed, but only abnormal results are displayed)  Labs Reviewed  URINALYSIS, ROUTINE W REFLEX MICROSCOPIC - Abnormal; Notable for the following components:      Result Value   APPearance HAZY (*)    Ketones, ur 5 (*)    All other components within normal limits  SURGICAL  PCR SCREEN  CBC WITH DIFFERENTIAL/PLATELET  BASIC METABOLIC PANEL  PROTIME-INR  APTT  TYPE AND SCREEN     IMAGES:   EKG: 04/16/2018 Rate 76 bpm  Normal sinus rhythm Normal ECG No significant change since last tracing  CV: Echo 10/08/2011 Study Conclusions   - Left ventricle: The cavity size was normal. Wall thickness  was increased in a pattern of mild LVH. Systolic function  was normal. The estimated ejection fraction was in the  range of 60% to 65%. Wall motion was normal; there were no  regional wall motion abnormalities.  - Aortic valve: There was no stenosis.  - Mitral valve: Trivial regurgitation.  - Right ventricle: The cavity size was normal. Systolic  function was normal.  - Pulmonary arteries: PA peak pressure: 11mm Hg (S).  - Inferior vena cava: The vessel was normal in size; the  respirophasic diameter changes were in the normal range (=  50%); findings are consistent with normal central venous  pressure.  - Pericardium, extracardiac: A trivial pericardial effusion  was identified posterior to the heart.   Past Medical History:  Diagnosis Date  . Arthritis    osteoarthritis both knees  . Cardiac asystole ([place])    a.  Felt to be 2/2 high vagal tone in setting of knee surgery;  b. 09/2011 Echo: EF 60-65%.  . Cervical cancer ([place]) 1984  . 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 right knee - s/p Knee surgery 09/2011  . Complication  of anesthesia    a. Vasovagal syncope with bradycardia and asystole requiring brief chest compression 02/07/12 & hypotension  . Dental crowns present   . Gallbladder polyp   . GERD (gastroesophageal reflux disease)   . Heart murmur    born with heart mumur patent dut surgery , developed mitral valve prolapse later in life  Fixed 1956 Patent Ductus in [place]   . History of kidney stones   . Hypercholesterolemia   . Insomnia   . Lateral meniscal tear 09/2011   a. right knee  . Liver hemangioma   .  Mechanical ptosis of bilateral eyelids    upper eyelids  . Medial meniscus tear 09/2011   a. right knee  . Mitral valve prolapse   . Myogenic ptosis    left upper eyelid  . Paroxysmal atrial fibrillation ([place])    a. 09/2011 in setting of post-op knee  . PONV (postoperative nausea and vomiting)    flat line after prophol, hx of bp droppin also  . Positional vertigo   . Stroke ([place])    Left hand Weaker  . Syncope, vasovagal 2005   a. exacerbated by pain/surgical procedures    Past Surgical History:  Procedure Laterality Date  . BROW LIFT Bilateral 04/16/2018   Procedure: BLEPHAROPLASTY BOTH UPPER LIDS;  Surgeon: [name], MD;  Location: [place];  Service: Ophthalmology;  Laterality: Bilateral;  . COLONOSCOPY WITH PROPOFOL N/A 02/19/2017   Procedure: COLONOSCOPY WITH PROPOFOL;  Surgeon: Arta Silence, MD;  [facility];  Service: Endoscopy;  Laterality: N/A;  . DILATION AND CURETTAGE OF UTERUS    . KNEE ARTHROSCOPY  10/08/2011   Procedure: ARTHROSCOPY KNEE;  Surgeon: [name], MD;  Location: [place];  Service: Orthopedics;  Laterality: Right;  right knee arthroscopy partial medial and lateral menisectomy and chondroplasty  . KNEE ARTHROSCOPY Left 01/04/2014   Procedure: ARTHROSCOPY KNEE;  Surgeon: [name], MD;  Location: [place];  Service: Orthopedics;  Laterality: Left;  Patient flatlined at outpatient surgery center when she had same surgery a year ago ended up in ICU for two days has had cardiac issues since 2013.  Marland Kitchen PATENT DUCTUS ARTERIOUS REPAIR  1956  . PTOSIS REPAIR Left 04/16/2018   Procedure: PTOSIS REPAIR LEFT EYE;  Surgeon: [name], MD;  Location: [place];  Service: Ophthalmology;  Laterality: Left;  . TOTAL ABDOMINAL HYSTERECTOMY  1991   due to cervical cancer  . TOTAL KNEE ARTHROPLASTY Left 10/14/2019   Procedure: LEFT TOTAL KNEE ARTHROPLASTY;  Surgeon: Melrose Nakayama, MD;  [facility];  Service: Orthopedics;   Laterality: Left;  . TUBAL LIGATION      MEDICATIONS: . acetaminophen (TYLENOL) 500 MG tablet  . aspirin 81 MG chewable tablet  . atorvastatin (LIPITOR) 40 MG tablet  . calcium carbonate (TUMS - DOSED IN MG ELEMENTAL CALCIUM) 500 MG chewable tablet  . Cholecalciferol (VITAMIN D) 50 MCG (2000 UT) CAPS  . losartan (COZAAR) 50 MG tablet  . PREVIDENT 0.2 % SOLN  . zaleplon (SONATA) 10 MG capsule   No current facility-administered medications for this encounter.   Konrad Felix, PA-C [facility] Testing [phone]

## 2020-09-06 NOTE — H&P (Signed)
TOTAL KNEE REVISION ADMISSION H&P  Patient is being admitted for left revision total knee arthroplasty.  Subjective:  Chief Complaint:left knee pain.  HPI: Allison Hunt, 70 y.o. female, has a history of pain and functional disability in the left knee(s) due to Arthroplasty with instability and patient has failed non-surgical conservative treatments for greater than 12 weeks to include NSAID's and/or analgesics, flexibility and strengthening excercises, supervised PT with diminished ADL's post treatment, use of assistive devices, weight reduction as appropriate and activity modification. The indications for the revision of the total knee arthroplasty are fracture or mechanical failure of one or components. Onset of symptoms was gradual starting less than1 years ago with gradually worsening course since that time.  Prior procedures on the left knee(s) include arthroplasty.  Patient currently rates pain in the left knee(s) at 10 out of 10 with activity. There is worsening of pain with activity and weight bearing, pain that interferes with activities of daily living and joint swelling.  Patient has evidence of well aligned TKR prothesis by imaging studies. This condition presents safety issues increasing the risk of falls.  There is no current active infection.  Patient Active Problem List   Diagnosis Date Noted  . Primary osteoarthritis of left knee 10/14/2019  . Meniscus tear 01/04/2014  . Dyslipidemia 05/19/2012  . Atrial fibrillation 10/08/2011  . PONV (postoperative nausea and vomiting)   . Syncope, vasovagal   . Cardiac asystole [facility])    Past Medical History:  Diagnosis Date  . Arthritis    osteoarthritis both knees  . Cardiac asystole ([place])    a.  Felt to be 2/2 high vagal tone in setting of knee surgery;  b. 09/2011 Echo: EF 60-65%.  . Cervical cancer ([place]) 1984  . 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 right knee - s/p Knee surgery 09/2011  . Complication of anesthesia    a.  Vasovagal syncope with bradycardia and asystole requiring brief chest compression 02/07/12 & hypotension  . Dental crowns present   . Gallbladder polyp   . GERD (gastroesophageal reflux disease)   . 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 History of kidney stones   . Hypercholesterolemia   . Insomnia   . Lateral meniscal tear 09/2011   a. right knee  . Liver hemangioma   . Mechanical ptosis of bilateral eyelids    upper eyelids  . Medial meniscus tear 09/2011   a. right knee  . Mitral valve prolapse   . Myogenic ptosis    left upper eyelid  . Paroxysmal atrial fibrillation ([place])    a. 09/2011 in setting of post-op knee  . PONV (postoperative nausea and vomiting)    flat line after prophol, hx of bp droppin also  . Positional vertigo   . Stroke ([place])    Left hand Weaker  . Syncope, vasovagal 2005   a. exacerbated by pain/surgical procedures    Past Surgical History:  Procedure Laterality Date  . BROW LIFT Bilateral 04/16/2018   Procedure: BLEPHAROPLASTY BOTH UPPER LIDS;  Surgeon: [name], MD;  Location: [place];  Service: Ophthalmology;  Laterality: Bilateral;  . COLONOSCOPY WITH PROPOFOL N/A 02/19/2017   Procedure: COLONOSCOPY WITH PROPOFOL;  Surgeon: Arta Silence, MD;  Location: [facility];  Service: Endoscopy;  Laterality: N/A;  . DILATION AND CURETTAGE OF UTERUS    . KNEE ARTHROSCOPY  10/08/2011   Procedure: ARTHROSCOPY KNEE;  Surgeon: [name], MD;  Location: [place];  Service: Orthopedics;  Laterality: Right;  right knee arthroscopy partial medial and lateral menisectomy and chondroplasty  . KNEE ARTHROSCOPY Left 01/04/2014   Procedure: ARTHROSCOPY KNEE;  Surgeon: [name], MD;  Location: [place];  Service: Orthopedics;  Laterality: Left;  Patient flatlined at outpatient surgery center when she had same surgery a year ago  ended up in ICU for two days has had cardiac issues since 2013.  Marland Kitchen PATENT DUCTUS ARTERIOUS REPAIR  1956  . PTOSIS REPAIR Left 04/16/2018   Procedure: PTOSIS REPAIR LEFT EYE;  Surgeon: [name], MD;  Location: [place];  Service: Ophthalmology;  Laterality: Left;  . TOTAL ABDOMINAL HYSTERECTOMY  1991   due to cervical cancer  . TOTAL KNEE ARTHROPLASTY Left 10/14/2019   Procedure: LEFT TOTAL KNEE ARTHROPLASTY;  Surgeon: Melrose Nakayama, MD;  Location: [facility];  Service: Orthopedics;  Laterality: Left;  . TUBAL LIGATION      No current facility-administered medications for this encounter.   Current Outpatient Medications  Medication Sig Dispense Refill Last Dose  . acetaminophen (TYLENOL) 500 MG tablet Take 500 mg by mouth every 6 (six) hours as needed for moderate pain.     Marland Kitchen aspirin 81 MG chewable tablet Chew 81 mg by mouth in the morning.     Marland Kitchen atorvastatin (LIPITOR) 40 MG tablet Take 40 mg by mouth at bedtime.     . calcium carbonate (TUMS - DOSED IN MG ELEMENTAL CALCIUM) 500 MG chewable tablet Chew 1-2 tablets by mouth 3 (three) times daily as needed for indigestion or heartburn.     . Cholecalciferol (VITAMIN D) 50 MCG (2000 UT) CAPS Take 2,000 Units by mouth in the morning.     Marland Kitchen losartan (COZAAR) 50 MG tablet Take 50 mg by mouth in the morning.     Marland Kitchen PREVIDENT 0.2 % SOLN Take 15 mLs by mouth at bedtime.      . zaleplon (SONATA) 10 MG capsule Take 10 mg by mouth at bedtime and may repeat dose one time if needed.      Allergies  Allergen Reactions  . Propofol Anaphylaxis and Other (See Comments)    Pt "flat lined" twice when she had propofol administered to her during another surgery  . Demerol Nausea And Vomiting  . Tramadol Nausea And Vomiting and Other (See Comments)    Pt must take with food to tolerate medication    Social History   Tobacco Use  . Smoking status: Former Smoker    Packs/day: 1.00    Years: 23.00    Pack years: 23.00    Start date: 1968  . Smokeless  tobacco: Never Used  . Tobacco comment: quit smoking cigarettes in 1991  Substance Use Topics  . Alcohol use: Yes    Comment: 1 daily glass of wine    Family History  Problem Relation Age of Onset  . Anemia Father   . Heart attack Father   . Pulmonary embolism Father   . Colon cancer Father   . Alzheimer's disease Mother   . Lupus Sister   . Aortic stenosis Son        with AVR at 34 y/o  . Suicidality Paternal Grandfather   . Dementia Paternal Grandmother   . Colon cancer Maternal Grandmother       Review of Systems  Musculoskeletal: Positive for arthralgias.  Left knee  All other systems reviewed and are negative.    Objective:  Physical Exam Constitutional:      Appearance: Normal appearance.  HENT:     Head: Normocephalic and atraumatic.     Nose: Nose normal.     Mouth/Throat:     Pharynx: Oropharynx is clear.  Eyes:     Extraocular Movements: Extraocular movements intact.  Cardiovascular:     Rate and Rhythm: Normal rate and regular rhythm.  Pulmonary:     Effort: Pulmonary effort is normal.  Abdominal:     Palpations: Abdomen is soft.  Musculoskeletal:     Cervical back: Normal range of motion.     Comments: Her replaced knee moves well from 0-110.  She has some instability to varus and valgus stress in extension and some laxity to drawer test.  I do not feel an effusion.  Her scar is benign.    Skin:    General: Skin is warm and dry.  Neurological:     General: No focal deficit present.     Mental Status: She is alert and oriented to person, place, and time.  Psychiatric:        Mood and Affect: Mood normal.        Behavior: Behavior normal.        Thought Content: Thought content normal.        Judgment: Judgment normal.     Vital signs in last 24 hours:    Labs:  Estimated body mass index is 26.89 kg/m as calculated from the following:   Height as of 08/31/20: 5\' 2"  (1.575 m).   Weight as of 08/31/20: 66.7 kg.  Imaging Review Plain  radiographs demonstrate a well aligned TKR prosthesis.The bone quality appears to be good for age and reported activity level.   Assessment/Plan:  End stage primary arthritis, left knee(s) with failed previous arthroplasty.   The patient history, physical examination, clinical judgment of the provider and imaging studies are consistent with end stage degenerative joint disease of the left knee(s), previous total knee arthroplasty. Revision total knee arthroplasty is deemed medically necessary. The treatment options including medical management, injection therapy, arthroscopy and revision arthroplasty were discussed at length. The risks and benefits of revision total knee arthroplasty were presented and reviewed. The risks due to aseptic loosening, infection, stiffness, patella tracking problems, thromboembolic complications and other imponderables were discussed. The patient acknowledged the explanation, agreed to proceed with the plan and consent was signed. Patient is being admitted for inpatient treatment for surgery, pain control, PT, OT, prophylactic antibiotics, VTE prophylaxis, progressive ambulation and ADL's and discharge planning.The patient is planning to be discharged home with home health services

## 2020-09-07 DIAGNOSIS — E782 Mixed hyperlipidemia: Secondary | ICD-10-CM | POA: Diagnosis not present

## 2020-09-08 ENCOUNTER — Other Ambulatory Visit (HOSPITAL_COMMUNITY)
Admission: RE | Admit: 2020-09-08 | Discharge: 2020-09-08 | Disposition: A | Discharge status: Home / Self Care | Payer: PPO | Source: Ambulatory Visit | Attending: Orthopaedic Surgery | Admitting: Orthopaedic Surgery

## 2020-09-08 DIAGNOSIS — Z01812 Encounter for preprocedural laboratory examination: Secondary | ICD-10-CM | POA: Diagnosis not present

## 2020-09-08 DIAGNOSIS — Z20822 Contact with and (suspected) exposure to covid-19: Secondary | ICD-10-CM | POA: Diagnosis not present

## 2020-09-08 DIAGNOSIS — Z8673 Personal history of transient ischemic attack (TIA), and cerebral infarction without residual deficits: Secondary | ICD-10-CM | POA: Diagnosis not present

## 2020-09-09 LAB — SARS CORONAVIRUS 2 (TAT 6-24 HRS): SARS Coronavirus 2: NEGATIVE

## 2020-09-11 MED ORDER — BUPIVACAINE LIPOSOME 1.3 % IJ SUSP
20.0000 mL | Freq: Once | INTRAMUSCULAR | Status: DC
  Filled 2020-09-11: qty 20

## 2020-09-11 MED ORDER — TRANEXAMIC ACID 1000 MG/10ML IV SOLN
2000.0000 mg | INTRAVENOUS | Status: DC
  Filled 2020-09-11: qty 20

## 2020-09-12 ENCOUNTER — Encounter (HOSPITAL_COMMUNITY): Payer: Self-pay | Admitting: Orthopaedic Surgery

## 2020-09-12 ENCOUNTER — Inpatient Hospital Stay (HOSPITAL_COMMUNITY)
Admission: RE | Admit: 2020-09-12 | Discharge: 2020-09-13 | DRG: 489 | Disposition: A | Discharge status: Home / Self Care | Payer: PPO | Attending: Orthopaedic Surgery | Admitting: Orthopaedic Surgery

## 2020-09-12 ENCOUNTER — Encounter (HOSPITAL_COMMUNITY)
Admission: RE | Disposition: A | Discharge status: Home / Self Care | Payer: Self-pay | Source: Home / Self Care | Attending: Orthopaedic Surgery

## 2020-09-12 ENCOUNTER — Inpatient Hospital Stay (HOSPITAL_COMMUNITY): Payer: PPO | Admitting: Anesthesiology

## 2020-09-12 ENCOUNTER — Other Ambulatory Visit: Payer: Self-pay

## 2020-09-12 ENCOUNTER — Inpatient Hospital Stay (HOSPITAL_COMMUNITY): Payer: PPO | Admitting: Physician Assistant

## 2020-09-12 DIAGNOSIS — G8918 Other acute postprocedural pain: Secondary | ICD-10-CM | POA: Diagnosis not present

## 2020-09-12 DIAGNOSIS — Z820 Family history of epilepsy and other diseases of the nervous system: Secondary | ICD-10-CM | POA: Diagnosis not present

## 2020-09-12 DIAGNOSIS — Z8674 Personal history of sudden cardiac arrest: Secondary | ICD-10-CM

## 2020-09-12 DIAGNOSIS — Z832 Family history of diseases of the blood and blood-forming organs and certain disorders involving the immune mechanism: Secondary | ICD-10-CM

## 2020-09-12 DIAGNOSIS — T8489XA Other specified complication of internal orthopedic prosthetic devices, implants and grafts, initial encounter: Secondary | ICD-10-CM | POA: Diagnosis not present

## 2020-09-12 DIAGNOSIS — Z96652 Presence of left artificial knee joint: Secondary | ICD-10-CM | POA: Diagnosis present

## 2020-09-12 DIAGNOSIS — Y792 Prosthetic and other implants, materials and accessory orthopedic devices associated with adverse incidents: Secondary | ICD-10-CM | POA: Diagnosis present

## 2020-09-12 DIAGNOSIS — G4700 Insomnia, unspecified: Secondary | ICD-10-CM | POA: Diagnosis not present

## 2020-09-12 DIAGNOSIS — Z79899 Other long term (current) drug therapy: Secondary | ICD-10-CM

## 2020-09-12 DIAGNOSIS — Z7982 Long term (current) use of aspirin: Secondary | ICD-10-CM | POA: Diagnosis not present

## 2020-09-12 DIAGNOSIS — Z800 Family history of malignant neoplasm of digestive organs: Secondary | ICD-10-CM | POA: Diagnosis not present

## 2020-09-12 DIAGNOSIS — E7800 Pure hypercholesterolemia, unspecified: Secondary | ICD-10-CM | POA: Diagnosis not present

## 2020-09-12 DIAGNOSIS — Z8541 Personal history of malignant neoplasm of cervix uteri: Secondary | ICD-10-CM | POA: Diagnosis not present

## 2020-09-12 DIAGNOSIS — Z8774 Personal history of (corrected) congenital malformations of heart and circulatory system: Secondary | ICD-10-CM | POA: Diagnosis not present

## 2020-09-12 DIAGNOSIS — E785 Hyperlipidemia, unspecified: Secondary | ICD-10-CM | POA: Diagnosis present

## 2020-09-12 DIAGNOSIS — T8484XA Pain due to internal orthopedic prosthetic devices, implants and grafts, initial encounter: Secondary | ICD-10-CM | POA: Diagnosis not present

## 2020-09-12 DIAGNOSIS — I4891 Unspecified atrial fibrillation: Secondary | ICD-10-CM | POA: Diagnosis not present

## 2020-09-12 DIAGNOSIS — Z87442 Personal history of urinary calculi: Secondary | ICD-10-CM | POA: Diagnosis not present

## 2020-09-12 DIAGNOSIS — T84023A Instability of internal left knee prosthesis, initial encounter: Secondary | ICD-10-CM | POA: Diagnosis present

## 2020-09-12 DIAGNOSIS — Z8673 Personal history of transient ischemic attack (TIA), and cerebral infarction without residual deficits: Secondary | ICD-10-CM

## 2020-09-12 DIAGNOSIS — Z8249 Family history of ischemic heart disease and other diseases of the circulatory system: Secondary | ICD-10-CM

## 2020-09-12 DIAGNOSIS — Z87891 Personal history of nicotine dependence: Secondary | ICD-10-CM

## 2020-09-12 HISTORY — PX: TOTAL KNEE REVISION: SHX996

## 2020-09-12 SURGERY — TOTAL KNEE REVISION
Anesthesia: Monitor Anesthesia Care | Site: Knee | Laterality: Left

## 2020-09-12 MED ORDER — BUPIVACAINE HCL (PF) 0.25 % IJ SOLN
INTRAMUSCULAR | Status: DC | PRN
  Administered 2020-09-12: 20 mL

## 2020-09-12 MED ORDER — PROPOFOL 500 MG/50ML IV EMUL
INTRAVENOUS | Status: DC | PRN
  Administered 2020-09-12: 100 ug/kg/min via INTRAVENOUS

## 2020-09-12 MED ORDER — AMISULPRIDE (ANTIEMETIC) 5 MG/2ML IV SOLN: 10.0000 mg | Freq: Once | INTRAVENOUS | Status: DC | PRN

## 2020-09-12 MED ORDER — POVIDONE-IODINE 10 % EX SWAB
2.0000 "application " | Freq: Once | CUTANEOUS | Status: AC
  Administered 2020-09-12: 2 via TOPICAL

## 2020-09-12 MED ORDER — SODIUM CHLORIDE (PF) 0.9 % IJ SOLN
INTRAMUSCULAR | Status: DC | PRN
  Administered 2020-09-12: 30 mL

## 2020-09-12 MED ORDER — PHENYLEPHRINE HCL (PRESSORS) 10 MG/ML IV SOLN
INTRAVENOUS | Status: AC
  Filled 2020-09-12: qty 1

## 2020-09-12 MED ORDER — HYDROCODONE-ACETAMINOPHEN 5-325 MG PO TABS: 1.0000 | ORAL_TABLET | ORAL | Status: DC | PRN

## 2020-09-12 MED ORDER — HYDROCODONE-ACETAMINOPHEN 7.5-325 MG PO TABS
1.0000 | ORAL_TABLET | ORAL | Status: DC | PRN
  Administered 2020-09-13: 1 via ORAL
  Filled 2020-09-12: qty 1

## 2020-09-12 MED ORDER — FENTANYL CITRATE (PF) 100 MCG/2ML IJ SOLN
INTRAMUSCULAR | Status: AC
  Filled 2020-09-12: qty 2

## 2020-09-12 MED ORDER — DIPHENHYDRAMINE HCL 12.5 MG/5ML PO ELIX: 12.5000 mg | ORAL_SOLUTION | ORAL | Status: DC | PRN

## 2020-09-12 MED ORDER — TRANEXAMIC ACID-NACL 1000-0.7 MG/100ML-% IV SOLN
1000.0000 mg | INTRAVENOUS | Status: AC
  Administered 2020-09-12: 1000 mg via INTRAVENOUS
  Filled 2020-09-12: qty 100

## 2020-09-12 MED ORDER — ACETAMINOPHEN 500 MG PO TABS
500.0000 mg | ORAL_TABLET | Freq: Four times a day (QID) | ORAL | Status: AC
Start: 2020-09-12 — End: 2020-09-13
  Administered 2020-09-12 – 2020-09-13 (×4): 500 mg via ORAL
  Filled 2020-09-12 (×4): qty 1

## 2020-09-12 MED ORDER — PROPOFOL 10 MG/ML IV BOLUS
INTRAVENOUS | Status: DC | PRN
  Administered 2020-09-12: 20 mg via INTRAVENOUS
  Administered 2020-09-12: 30 mg via INTRAVENOUS
  Administered 2020-09-12: 20 mg via INTRAVENOUS

## 2020-09-12 MED ORDER — BUPIVACAINE-EPINEPHRINE (PF) 0.25% -1:200000 IJ SOLN
INTRAMUSCULAR | Status: AC
  Filled 2020-09-12: qty 30

## 2020-09-12 MED ORDER — PHENYLEPHRINE HCL-NACL 10-0.9 MG/250ML-% IV SOLN
25.0000 ug/min | INTRAVENOUS | Status: DC
  Administered 2020-09-12: 5 ug/min via INTRAVENOUS
  Administered 2020-09-12: 1 ug/min via INTRAVENOUS
  Administered 2020-09-12: 15 ug/min via INTRAVENOUS
  Administered 2020-09-12: 25 ug/min via INTRAVENOUS

## 2020-09-12 MED ORDER — BUPIVACAINE LIPOSOME 1.3 % IJ SUSP
INTRAMUSCULAR | Status: DC | PRN
  Administered 2020-09-12: 10 mL via PERINEURAL

## 2020-09-12 MED ORDER — SODIUM CHLORIDE 0.9 % IV SOLN: INTRAVENOUS | Status: DC | PRN

## 2020-09-12 MED ORDER — PROMETHAZINE HCL 25 MG/ML IJ SOLN: 6.2500 mg | INTRAMUSCULAR | Status: DC | PRN

## 2020-09-12 MED ORDER — CEFAZOLIN SODIUM-DEXTROSE 2-4 GM/100ML-% IV SOLN
2.0000 g | INTRAVENOUS | Status: AC
  Administered 2020-09-12: 2 g via INTRAVENOUS
  Filled 2020-09-12: qty 100

## 2020-09-12 MED ORDER — LIDOCAINE 2% (20 MG/ML) 5 ML SYRINGE
INTRAMUSCULAR | Status: DC | PRN
  Administered 2020-09-12: 20 mg via INTRAVENOUS

## 2020-09-12 MED ORDER — PHENOL 1.4 % MT LIQD: 1.0000 | OROMUCOSAL | Status: DC | PRN

## 2020-09-12 MED ORDER — LACTATED RINGERS IV BOLUS: 500.0000 mL | Freq: Once | INTRAVENOUS | Status: DC

## 2020-09-12 MED ORDER — OXYCODONE HCL 5 MG/5ML PO SOLN: 5.0000 mg | Freq: Once | ORAL | Status: DC | PRN

## 2020-09-12 MED ORDER — KETOROLAC TROMETHAMINE 15 MG/ML IJ SOLN
7.5000 mg | Freq: Four times a day (QID) | INTRAMUSCULAR | Status: AC
  Administered 2020-09-12 – 2020-09-13 (×4): 7.5 mg via INTRAVENOUS
  Filled 2020-09-12 (×4): qty 1

## 2020-09-12 MED ORDER — CHLORHEXIDINE GLUCONATE 0.12 % MT SOLN: 15.0000 mL | Freq: Once | OROMUCOSAL | Status: AC

## 2020-09-12 MED ORDER — ACETAMINOPHEN 500 MG PO TABS
1000.0000 mg | ORAL_TABLET | Freq: Once | ORAL | Status: AC
  Administered 2020-09-12: 1000 mg via ORAL
  Filled 2020-09-12: qty 2

## 2020-09-12 MED ORDER — ACETAMINOPHEN 325 MG PO TABS: 325.0000 mg | ORAL_TABLET | Freq: Four times a day (QID) | ORAL | Status: DC | PRN

## 2020-09-12 MED ORDER — ONDANSETRON HCL 4 MG/2ML IJ SOLN
INTRAMUSCULAR | Status: DC | PRN
  Administered 2020-09-12: 4 mg via INTRAVENOUS

## 2020-09-12 MED ORDER — METHOCARBAMOL 1000 MG/10ML IJ SOLN
500.0000 mg | Freq: Four times a day (QID) | INTRAVENOUS | Status: DC | PRN
  Filled 2020-09-12: qty 5

## 2020-09-12 MED ORDER — PROPOFOL 10 MG/ML IV BOLUS
INTRAVENOUS | Status: AC
  Filled 2020-09-12: qty 20

## 2020-09-12 MED ORDER — METOCLOPRAMIDE HCL 5 MG PO TABS: 5.0000 mg | ORAL_TABLET | Freq: Three times a day (TID) | ORAL | Status: DC | PRN

## 2020-09-12 MED ORDER — HYDROMORPHONE HCL 1 MG/ML IJ SOLN: 0.2500 mg | INTRAMUSCULAR | Status: DC | PRN

## 2020-09-12 MED ORDER — ORAL CARE MOUTH RINSE
15.0000 mL | Freq: Once | OROMUCOSAL | Status: AC
  Administered 2020-09-12: 15 mL via OROMUCOSAL

## 2020-09-12 MED ORDER — ONDANSETRON HCL 4 MG/2ML IJ SOLN
INTRAMUSCULAR | Status: AC
  Filled 2020-09-12: qty 2

## 2020-09-12 MED ORDER — METHOCARBAMOL 500 MG PO TABS
500.0000 mg | ORAL_TABLET | Freq: Four times a day (QID) | ORAL | Status: DC | PRN
Start: 2020-09-12 — End: 2020-09-13

## 2020-09-12 MED ORDER — ONDANSETRON HCL 4 MG/2ML IJ SOLN: 4.0000 mg | Freq: Four times a day (QID) | INTRAMUSCULAR | Status: DC | PRN

## 2020-09-12 MED ORDER — MIDAZOLAM HCL 5 MG/5ML IJ SOLN
INTRAMUSCULAR | Status: DC | PRN
  Administered 2020-09-12 (×2): 1 mg via INTRAVENOUS

## 2020-09-12 MED ORDER — BUPIVACAINE-EPINEPHRINE 0.25% -1:200000 IJ SOLN
INTRAMUSCULAR | Status: DC | PRN
  Administered 2020-09-12: 30 mL

## 2020-09-12 MED ORDER — TRANEXAMIC ACID-NACL 1000-0.7 MG/100ML-% IV SOLN
1000.0000 mg | Freq: Once | INTRAVENOUS | Status: AC
  Administered 2020-09-12: 1000 mg via INTRAVENOUS
  Filled 2020-09-12: qty 100

## 2020-09-12 MED ORDER — CEFAZOLIN SODIUM-DEXTROSE 2-4 GM/100ML-% IV SOLN
2.0000 g | Freq: Four times a day (QID) | INTRAVENOUS | Status: AC
  Administered 2020-09-12 (×2): 2 g via INTRAVENOUS
  Filled 2020-09-12 (×2): qty 100

## 2020-09-12 MED ORDER — SODIUM CHLORIDE 0.9 % IV SOLN: 250.0000 mL | INTRAVENOUS | Status: DC

## 2020-09-12 MED ORDER — ATORVASTATIN CALCIUM 40 MG PO TABS
80.0000 mg | ORAL_TABLET | Freq: Every day | ORAL | Status: DC
  Administered 2020-09-12: 80 mg via ORAL
  Filled 2020-09-12: qty 2

## 2020-09-12 MED ORDER — ASPIRIN 81 MG PO CHEW
81.0000 mg | CHEWABLE_TABLET | Freq: Two times a day (BID) | ORAL | Status: DC
  Administered 2020-09-13: 81 mg via ORAL
  Filled 2020-09-12: qty 1

## 2020-09-12 MED ORDER — MORPHINE SULFATE (PF) 2 MG/ML IV SOLN: 0.5000 mg | INTRAVENOUS | Status: DC | PRN

## 2020-09-12 MED ORDER — DEXAMETHASONE SODIUM PHOSPHATE 10 MG/ML IJ SOLN
INTRAMUSCULAR | Status: DC | PRN
  Administered 2020-09-12: 7 mg via INTRAVENOUS

## 2020-09-12 MED ORDER — BUPIVACAINE LIPOSOME 1.3 % IJ SUSP
INTRAMUSCULAR | Status: DC | PRN
  Administered 2020-09-12: 20 mL

## 2020-09-12 MED ORDER — LOSARTAN POTASSIUM 50 MG PO TABS
100.0000 mg | ORAL_TABLET | Freq: Every day | ORAL | Status: DC
  Administered 2020-09-12 – 2020-09-13 (×2): 100 mg via ORAL
  Filled 2020-09-12 (×2): qty 2

## 2020-09-12 MED ORDER — DOCUSATE SODIUM 100 MG PO CAPS
100.0000 mg | ORAL_CAPSULE | Freq: Two times a day (BID) | ORAL | Status: DC
  Administered 2020-09-12 – 2020-09-13 (×2): 100 mg via ORAL
  Filled 2020-09-12 (×3): qty 1

## 2020-09-12 MED ORDER — LACTATED RINGERS IV SOLN: INTRAVENOUS | Status: DC

## 2020-09-12 MED ORDER — PROPOFOL 500 MG/50ML IV EMUL
INTRAVENOUS | Status: AC
  Filled 2020-09-12: qty 50

## 2020-09-12 MED ORDER — OXYCODONE HCL 5 MG PO TABS: 5.0000 mg | ORAL_TABLET | Freq: Once | ORAL | Status: DC | PRN

## 2020-09-12 MED ORDER — KETOROLAC TROMETHAMINE 30 MG/ML IJ SOLN: 15.0000 mg | Freq: Once | INTRAMUSCULAR | Status: DC | PRN

## 2020-09-12 MED ORDER — BUPIVACAINE IN DEXTROSE 0.75-8.25 % IT SOLN
INTRATHECAL | Status: DC | PRN
  Administered 2020-09-12: 2 mL via INTRATHECAL

## 2020-09-12 MED ORDER — SODIUM CHLORIDE (PF) 0.9 % IJ SOLN
INTRAMUSCULAR | Status: AC
  Filled 2020-09-12: qty 30

## 2020-09-12 MED ORDER — FENTANYL CITRATE (PF) 100 MCG/2ML IJ SOLN
INTRAMUSCULAR | Status: DC | PRN
  Administered 2020-09-12 (×2): 50 ug via INTRAVENOUS

## 2020-09-12 MED ORDER — LACTATED RINGERS IV BOLUS: 250.0000 mL | Freq: Once | INTRAVENOUS | Status: DC

## 2020-09-12 MED ORDER — ALUM & MAG HYDROXIDE-SIMETH 200-200-20 MG/5ML PO SUSP: 30.0000 mL | ORAL | Status: DC | PRN

## 2020-09-12 MED ORDER — BISACODYL 5 MG PO TBEC: 5.0000 mg | DELAYED_RELEASE_TABLET | Freq: Every day | ORAL | Status: DC | PRN

## 2020-09-12 MED ORDER — MIDAZOLAM HCL 2 MG/2ML IJ SOLN
INTRAMUSCULAR | Status: AC
  Filled 2020-09-12: qty 2

## 2020-09-12 MED ORDER — SODIUM CHLORIDE 0.9 % IR SOLN
Status: DC | PRN
  Administered 2020-09-12: 1000 mL

## 2020-09-12 MED ORDER — PHENYLEPHRINE HCL-NACL 10-0.9 MG/250ML-% IV SOLN
INTRAVENOUS | Status: DC | PRN
  Administered 2020-09-12: 20 ug/min via INTRAVENOUS

## 2020-09-12 MED ORDER — PHENYLEPHRINE HCL-NACL 10-0.9 MG/250ML-% IV SOLN: 0.0000 ug/min | INTRAVENOUS | Status: DC

## 2020-09-12 MED ORDER — SODIUM FLUORIDE 0.2 % MT SOLN: 15.0000 mL | Freq: Every day | OROMUCOSAL | Status: DC

## 2020-09-12 MED ORDER — MENTHOL 3 MG MT LOZG: 1.0000 | LOZENGE | OROMUCOSAL | Status: DC | PRN

## 2020-09-12 MED ORDER — LIDOCAINE 2% (20 MG/ML) 5 ML SYRINGE
INTRAMUSCULAR | Status: AC
  Filled 2020-09-12: qty 5

## 2020-09-12 MED ORDER — ONDANSETRON HCL 4 MG PO TABS: 4.0000 mg | ORAL_TABLET | Freq: Four times a day (QID) | ORAL | Status: DC | PRN

## 2020-09-12 MED ORDER — ASPIRIN 81 MG PO CHEW: 81.0000 mg | CHEWABLE_TABLET | Freq: Two times a day (BID) | ORAL | 0 refills | Status: AC

## 2020-09-12 MED ORDER — 0.9 % SODIUM CHLORIDE (POUR BTL) OPTIME
TOPICAL | Status: DC | PRN
  Administered 2020-09-12: 1000 mL

## 2020-09-12 MED ORDER — ZOLPIDEM TARTRATE 5 MG PO TABS: 5.0000 mg | ORAL_TABLET | Freq: Every evening | ORAL | Status: DC | PRN

## 2020-09-12 MED ORDER — DEXAMETHASONE SODIUM PHOSPHATE 10 MG/ML IJ SOLN
INTRAMUSCULAR | Status: AC
  Filled 2020-09-12: qty 1

## 2020-09-12 MED ORDER — METOCLOPRAMIDE HCL 5 MG/ML IJ SOLN: 5.0000 mg | Freq: Three times a day (TID) | INTRAMUSCULAR | Status: DC | PRN

## 2020-09-12 SURGICAL SUPPLY — 29 items
ATTUNE PSRP INSR SZ4 12 KNEE (Insert) ×2 IMPLANT
BLADE SURG SZ10 CARB STEEL (BLADE) ×4 IMPLANT
BNDG CMPR MED 10X6 ELC LF (GAUZE/BANDAGES/DRESSINGS) ×1
BNDG ELASTIC 6X10 VLCR STRL LF (GAUZE/BANDAGES/DRESSINGS) ×2 IMPLANT
CUFF TOURN SGL QUICK 34 (TOURNIQUET CUFF) ×2
CUFF TRNQT CYL 34X4.125X (TOURNIQUET CUFF) ×1 IMPLANT
DRAPE U-SHAPE 47X51 STRL (DRAPES) ×2 IMPLANT
DRESSING AQUACEL AG SP 3.5X10 (GAUZE/BANDAGES/DRESSINGS) ×1 IMPLANT
DRSG AQUACEL AG SP 3.5X10 (GAUZE/BANDAGES/DRESSINGS) ×2
ELECT REM PT RETURN 15FT ADLT (MISCELLANEOUS) ×2 IMPLANT
GLOVE SRG 8 PF TXTR STRL LF DI (GLOVE) ×2 IMPLANT
GLOVE SURG ENC MOIS LTX SZ8 (GLOVE) ×4 IMPLANT
GLOVE SURG UNDER POLY LF SZ8 (GLOVE) ×4
GOWN STRL REUS W/TWL XL LVL3 (GOWN DISPOSABLE) ×4 IMPLANT
HOLDER FOLEY CATH W/STRAP (MISCELLANEOUS) ×2 IMPLANT
KIT BASIN OR (CUSTOM PROCEDURE TRAY) ×2 IMPLANT
KIT TURNOVER KIT A (KITS) ×2 IMPLANT
NS IRRIG 1000ML POUR BTL (IV SOLUTION) ×2 IMPLANT
PACK TOTAL KNEE CUSTOM (KITS) IMPLANT
PENCIL SMOKE EVACUATOR (MISCELLANEOUS) IMPLANT
STAPLER VISISTAT 35W (STAPLE) IMPLANT
SUT ETHIBOND NAB CT1 #1 30IN (SUTURE) ×4 IMPLANT
SUT VIC AB 0 CT1 36 (SUTURE) ×2 IMPLANT
SUT VIC AB 2-0 CT1 27 (SUTURE) ×2
SUT VIC AB 2-0 CT1 TAPERPNT 27 (SUTURE) ×1 IMPLANT
SUT VICRYL AB 3-0 FS1 BRD 27IN (SUTURE) ×2 IMPLANT
SYR 3ML LL SCALE MARK (SYRINGE) IMPLANT
TRAY FOLEY MTR SLVR 16FR STAT (SET/KITS/TRAYS/PACK) ×2 IMPLANT
WATER STERILE IRR 1000ML POUR (IV SOLUTION) ×2 IMPLANT

## 2020-09-12 NOTE — Transfer of Care (Signed)
Immediate Anesthesia Transfer of Care Note  Patient: Allison Hunt  Procedure(s) Performed: LEFT TOTAL KNEE REVISION (Left Knee)  Patient Location: PACU  Anesthesia Type:Spinal  Level of Consciousness: awake, alert  and oriented  Airway & Oxygen Therapy: Patient Spontanous Breathing and Patient connected to face mask oxygen  Post-op Assessment: Report given to RN and Post -op Vital signs reviewed and stable  Post vital signs: Reviewed and stable  Last Vitals:  Vitals Value Taken Time  BP 144/71 09/12/20 0908  Temp    Pulse 79 09/12/20 0911  Resp 19 09/12/20 0911  SpO2 96 % 09/12/20 0911  Vitals shown include unvalidated device data.  Last Pain:  Vitals:   09/12/20 0624  TempSrc: Oral         Complications: No complications documented.

## 2020-09-12 NOTE — Plan of Care (Signed)
  Problem: Education: Goal: Knowledge of General Education information will improve Description Including pain rating scale, medication(s)/side effects and non-pharmacologic comfort measures Outcome: Progressing   

## 2020-09-12 NOTE — Interval H&P Note (Signed)
History and Physical Interval Note:  09/12/2020 7:22 AM  Allison Hunt  has presented today for surgery, with the diagnosis of PAINFUL LEFT KNEE REPLACEMENT.  The various methods of treatment have been discussed with the patient and family. After consideration of risks, benefits and other options for treatment, the patient has consented to  Procedure(s) with comments: LEFT TOTAL KNEE REVISION (Left) - 3E BED; INS REQUIRES SURGERY ADMIT STATUS BUT PROVIDER WILL DISCHARGE WITHIN 24HRS as a surgical intervention.  The patient's history has been reviewed, patient examined, no change in status, stable for surgery.  I have reviewed the patient's chart and labs.  Questions were answered to the patient's satisfaction.     [name]

## 2020-09-12 NOTE — Anesthesia Procedure Notes (Signed)
Anesthesia Regional Block: Adductor canal block   Pre-Anesthetic Checklist: ,, timeout performed, Correct Patient, Correct Site, Correct Laterality, Correct Procedure, Correct Position, site marked, Risks and benefits discussed,  Surgical consent,  Pre-op evaluation,  At surgeon's request and post-op pain management  Laterality: Left  Prep: Maximum Sterile Barrier Precautions used, chloraprep       Needles:  Injection technique: Single-shot  Needle Type: Echogenic Stimulator Needle     Needle Length: 9cm  Needle Gauge: 22     Additional Needles:   Procedures:,,,, ultrasound used (permanent image in chart),,,,  Narrative:  Start time: 09/12/2020 7:05 AM End time: 09/12/2020 7:10 AM Injection made incrementally with aspirations every 5 mL.  Performed by: Personally  Anesthesiologist: Pervis Hocking, DO  Additional Notes: Monitors applied. No increased pain on injection. No increased resistance to injection. Injection made in 5cc increments. Good needle visualization. Patient tolerated procedure well.

## 2020-09-12 NOTE — Addendum Note (Signed)
Addendum  created 09/12/20 0932 by Pervis Hocking, DO   Child order released for a procedure order, Clinical Note Signed, Intraprocedure Blocks edited, Intraprocedure Meds edited

## 2020-09-12 NOTE — Anesthesia Procedure Notes (Signed)
Date/Time: 09/12/2020 7:39 AM Performed by: Sharlette Dense, CRNA Oxygen Delivery Method: Simple face mask

## 2020-09-12 NOTE — Anesthesia Postprocedure Evaluation (Signed)
Anesthesia Post Note  Patient: Allison Hunt  Procedure(s) Performed: LEFT TOTAL KNEE REVISION (Left Knee)     Patient location during evaluation: PACU Anesthesia Type: Regional, MAC and Spinal Level of consciousness: oriented and awake and alert Pain management: pain level controlled Vital Signs Assessment: post-procedure vital signs reviewed and stable Respiratory status: spontaneous breathing and respiratory function stable Cardiovascular status: blood pressure returned to baseline and stable Postop Assessment: no headache, no backache, no apparent nausea or vomiting and spinal receding Anesthetic complications: no   No complications documented.  Last Vitals:  Vitals:   09/12/20 0908 09/12/20 0916  BP: (!) 144/71 (!) 157/73  Pulse: 76 68  Resp: 11 17  Temp: 36.5 C   SpO2: 98% 93%    Last Pain:  Vitals:   09/12/20 0908  TempSrc:   PainSc: 0-No pain                 Pervis Hocking

## 2020-09-12 NOTE — Op Note (Signed)
PREOP DIAGNOSIS: Unstable left TKR POSTOP DIAGNOSIS:  same PROCEDURE: LEFT TKR revision poly swap ANESTHESIA: Spinal and MAC ATTENDING SURGEON: [name] ASSISTANT: Allison Dolly PA  INDICATIONS FOR PROCEDURE: Allison Hunt is a 70 y.o. female who has struggled for a while with instability a year after TKR.  The patient is offered total knee replacement revision to thicker spacer.  Informed operative consent was obtained after discussion of possible risks of anesthesia, infection, neurovascular injury, DVT, and death.  The importance of the post-operative rehabilitation protocol to optimize result was stressed extensively with the patient.  SUMMARY OF FINDINGS AND PROCEDURE:  Allison Hunt was taken to the operative suite where under the above anesthesia a left knee replacement revision was performed.  We used the DePuyAttune system and placed  size 12 mm spacer up from a 6 mm spacer.  Allison Dolly PA-C assisted throughout and was invaluable to the completion of the case in that he helped retract and maintain exposure while I placed the components.  He also helped close thereby minimizing OR time.  The patient was admitted for appropriate post-op care to include perioperative antibiotics and mechanical and pharmacologic measures for DVT prophylaxis.  DESCRIPTION OF PROCEDURE:  Allison Hunt was taken to the operative suite where the above anesthesia was applied.  The patient was positioned supine and prepped and draped in normal sterile fashion.  An appropriate time out was performed.  After the administration of kefzol pre-op antibiotic the leg was elevated and exsanguinated and a tourniquet inflated.  Her old standard longitudinal incision was made on the anterior knee.  Dissection was carried down to the extensor mechanism.  All appropriate anti-infective measures were used including the pre-operative antibiotic, betadine impregnated drape, and closed hooded exhaust systems for each  member of the surgical team.  A medial parapatellar incision was made in the extensor mechanism and the knee cap flipped and the knee flexed.  A trial reduction was done bumping up the spacer from 6 to 29m. The knee easily came to full extension and the patella tracked well on flexion. We then placed the new spacer. The tourniquet was deflated and a small amount of bleeding was controlled with cautery and pressure.  The knee was irrigated thoroughly.  The extensor mechanism was re-approximated with #1 ethibond in interrupted fashion.  The knee was flexed and the repair was solid.  The subcutaneous tissues were re-approximated with #0 and #2-0 vicryl and the skin closed with a subcuticular stitch and steristrips.  A sterile dressing was applied.  Intraoperative fluids, EBL, and tourniquet time can be obtained from anesthesia records.  DISPOSITION:  The patient was taken to recovery room in stable condition and admitted for appropriate post-op care to include peri-operative antibiotic and DVT prophylaxis with mechanical and pharmacologic measures.  PHessie Dibble4/10/2020, 8:41 AM

## 2020-09-12 NOTE — Evaluation (Signed)
Physical Therapy Evaluation Patient Details Name: Allison Hunt MRN: [id number] DOB: 18-May-1951 Today's Date: 09/12/2020   History of Present Illness  70 y.o. female, has a history of pain and functional disability in the left knee(s) due to Arthroplasty with instability and patient. admitted for L TKA revision 09/12/20.  Clinical Impression  Pt is s/p TKA resulting in the deficits listed below (see PT Problem List).  Pt  amb ~ 30' with RW and min/guard  in room (pt request to stay in room today). Anticipate steady progress, pt is motivated to d/c tomorrow. Pt will benefit from skilled PT to increase their independence and safety with mobility to allow discharge to the venue listed below.      Follow Up Recommendations Follow surgeon's recommendation for DC plan and follow-up therapies    Equipment Recommendations  None recommended by PT    Recommendations for Other Services       Precautions / Restrictions Precautions Precautions: Fall;Knee Restrictions Weight Bearing Restrictions: No      Mobility  Bed Mobility Overal bed mobility: Needs Assistance Bed Mobility: Supine to Sit     Supine to sit: Supervision     General bed mobility comments: incr time    Transfers Overall transfer level: Needs assistance Equipment used: Rolling walker (2 wheeled) Transfers: Sit to/from Stand Sit to Stand: Min guard         General transfer comment: cues for hand placement  Ambulation/Gait Ambulation/Gait assistance: Min guard Gait Distance (Feet): 30 Feet (in room per pt request) Assistive device: Rolling walker (2 wheeled) Gait Pattern/deviations: Step-to pattern;Decreased stance time - left Gait velocity: decr   General Gait Details: cues for sequence, RW position  Stairs            Wheelchair Mobility    Modified Rankin (Stroke Patients Only)       Balance                                             Pertinent Vitals/Pain Pain  Assessment: No/denies pain    Home Living Family/patient expects to be discharged to:: Private residence Living Arrangements: Alone   Type of Home: Apartment Home Access: Stairs to enter   [employer] of Steps: 0 Home Layout: One level Home Equipment: Environmental consultant - 2 wheels;Walker - 4 wheels      Prior Function Level of Independence: Independent with assistive device(s)         Comments: prefers to amb with rollator     Hand Dominance        Extremity/Trunk Assessment   Upper Extremity Assessment Upper Extremity Assessment: Overall WFL for tasks assessed    Lower Extremity Assessment Lower Extremity Assessment: LLE deficits/detail LLE Deficits / Details: ankle WFL, knee extension and hip flexion 3/5       Communication   Communication: No difficulties  Cognition Arousal/Alertness: Awake/alert Behavior During Therapy: WFL for tasks assessed/performed Overall Cognitive Status: Within Functional Limits for tasks assessed                                        General Comments      Exercises Total Joint Exercises Ankle Circles/Pumps: AROM;Both;10 reps Quad Sets: 5 reps;Both;AROM   Assessment/Plan    PT Assessment Patient needs continued PT services  PT Problem List Decreased strength;Decreased mobility;Decreased range of motion;Decreased activity tolerance;Decreased balance       PT Treatment Interventions DME instruction;Therapeutic activities;Gait training;Stair training;Functional mobility training;Therapeutic exercise;Patient/family education    PT Goals (Current goals can be found in the Care Plan section)  Acute Rehab PT Goals Patient Stated Goal: go home Wednesday, have less knee pain PT Goal Formulation: With patient Time For Goal Achievement: 09/19/20 Potential to Achieve Goals: Good    Frequency 7X/week   Barriers to discharge        Co-evaluation               AM-PAC PT "6 Clicks" Mobility  Outcome  Measure Help needed turning from your back to your side while in a flat bed without using bedrails?: None Help needed moving from lying on your back to sitting on the side of a flat bed without using bedrails?: None Help needed moving to and from a bed to a chair (including a wheelchair)?: A Little Help needed standing up from a chair using your arms (e.g., wheelchair or bedside chair)?: A Little Help needed to walk in hospital room?: A Little Help needed climbing 3-5 steps with a railing? : A Little 6 Click Score: 20    End of Session Equipment Utilized During Treatment: Gait belt Activity Tolerance: Patient tolerated treatment well Patient left: in chair;with call bell/phone within reach;with chair alarm set Nurse Communication: Mobility status PT Visit Diagnosis: Difficulty in walking, not elsewhere classified (R26.2)    Time: 4917-9150 PT Time Calculation (min) (ACUTE ONLY): 19 min   Charges:   PT Evaluation $PT Eval Low Complexity: Adams, PT  Acute Rehab Dept ([place]) [phone] Pager [phone]  09/12/2020   [facility] 09/12/2020, 3:18 PM

## 2020-09-12 NOTE — Anesthesia Procedure Notes (Signed)
Spinal  Patient location during procedure: OR Start time: 09/12/2020 7:40 AM End time: 09/12/2020 7:44 AM Reason for block: surgical anesthesia Staffing Performed: resident/CRNA  Resident/CRNA: Sharlette Dense, CRNA Preanesthetic Checklist Completed: patient identified, IV checked, site marked, risks and benefits discussed, surgical consent, monitors and equipment checked, pre-op evaluation and timeout performed Spinal Block Patient position: sitting Prep: DuraPrep and site prepped and draped Patient monitoring: heart rate, continuous pulse ox and blood pressure Approach: midline Location: L3-4 Injection technique: single-shot Needle Needle type: Pencan  Needle gauge: 24 G Needle length: 9 cm Additional Notes Kit expiration date 11/07/2021 and lot # [id number] Clear free flow CSF, negative heme, negative paresthesia Tolerated well and returned to supine position

## 2020-09-12 NOTE — Anesthesia Preprocedure Evaluation (Addendum)
Anesthesia Evaluation  Patient identified by MRN, date of birth, ID band Patient awake    Reviewed: Allergy & Precautions, NPO status , Patient's Chart, lab work & pertinent test results  History of Anesthesia Complications (+) PONV and history of anesthetic complications (brief post-op asystole felt r/t increased vagal tone with N/V 01/2012)  Airway Mallampati: III  TM Distance: >3 FB Neck ROM: Full    Dental no notable dental hx. (+) Teeth Intact, Dental Advisory Given   Pulmonary former smoker,  23 pack year history    Pulmonary exam normal breath sounds clear to auscultation       Cardiovascular hypertension (BP 160/79 in preop), Pt. on medications Normal cardiovascular exam+ Valvular Problems/Murmurs MVP  Rhythm:Regular Rate:Normal  seen by cardiology 10/08/2019 for preoperative evaluation.  Per OV note, "History of asystole after exposure to general anesthesia for right knee surgery in 2013.  Her previous cardiologist felt that she had increased vagal tone in the setting of post-op n/v, and she had had no further episodes.  She subsequently had surgery on the other knee in 2015 and had no problems.    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, weaker L hand), Residual Symptoms negative psych ROS   GI/Hepatic Neg liver ROS, GERD  Controlled,  Endo/Other  negative endocrine ROS  Renal/GU negative Renal ROS  negative genitourinary   Musculoskeletal  (+) Arthritis , Osteoarthritis,  Painful L TKR   Abdominal   Peds  Hematology negative hematology ROS (+)   Anesthesia Other Findings   Reproductive/Obstetrics negative OB ROS                            Anesthesia Physical Anesthesia Plan  ASA: II  Anesthesia Plan: Spinal, MAC and Regional   Post-op Pain Management:  Regional for Post-op pain   Induction:   PONV Risk Score and Plan: 2 and Propofol infusion and TIVA  Airway  Management Planned: Natural Airway and Nasal Cannula  Additional Equipment: None  Intra-op Plan:   Post-operative Plan:   Informed Consent: I have reviewed the patients History and Physical, chart, labs and discussed the procedure including the risks, benefits and alternatives for the proposed anesthesia with the patient or authorized representative who has indicated his/her understanding and acceptance.       Plan Discussed with: CRNA  Anesthesia Plan Comments:         Anesthesia Quick Evaluation

## 2020-09-13 ENCOUNTER — Encounter (HOSPITAL_COMMUNITY): Payer: Self-pay | Admitting: Orthopaedic Surgery

## 2020-09-13 NOTE — Plan of Care (Signed)
Patient discharged home in stable condition 

## 2020-09-13 NOTE — Progress Notes (Signed)
Subjective: 1 Day Post-Op Procedure(s) (LRB): LEFT TOTAL KNEE REVISION (Left)   Patinet doing great. She is looking forward to going home today.  Activity level:  wbat Diet tolerance:  ok Voiding:  ok Patient reports pain as mild.    Objective: Vital signs in last 24 hours: Temp:  [97.7 F (36.5 C)-98.6 F (37 C)] 98.3 F (36.8 C) (04/06 0556) Pulse Rate:  [68-86] 71 (04/06 0556) Resp:  [11-21] 16 (04/06 0556) BP: (129-161)/(71-98) 159/81 (04/06 0556) SpO2:  [93 %-99 %] 97 % (04/06 0556) Weight:  [68 kg] 68 kg (04/05 1054)  Labs: No results for input(s): HGB in the last 72 hours. No results for input(s): WBC, RBC, HCT, PLT in the last 72 hours. No results for input(s): NA, K, CL, CO2, BUN, CREATININE, GLUCOSE, CALCIUM in the last 72 hours. No results for input(s): LABPT, INR in the last 72 hours.  Physical Exam:  Neurologically intact ABD soft Neurovascular intact Sensation intact distally Intact pulses distally Dorsiflexion/Plantar flexion intact Incision: dressing C/D/I and no drainage No cellulitis present Compartment soft  Assessment/Plan:  1 Day Post-Op Procedure(s) (LRB): LEFT TOTAL KNEE REVISION (Left) Advance diet Up with therapy D/C IV fluids Discharge home with home health today after cleared by PT. Follow up in office 2 weeks post op. Continue on ASA BID for dvt prevention.    Allison Hunt 09/13/2020, 8:19 AM

## 2020-09-13 NOTE — Discharge Summary (Signed)
Patient ID: Allison Hunt MRN: [id number] DOB/AGE: 1950/11/14 70 y.o.  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 Arthritis    osteoarthritis both knees  . Cardiac asystole ([place])    a.  Felt to be 2/2 high vagal tone in setting of knee surgery;  b. 09/2011 Echo: EF 60-65%.  . Cervical cancer ([place]) 1984  . 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 right knee - s/p Knee surgery 09/2011  . Complication of anesthesia    a. Vasovagal syncope with bradycardia and asystole requiring brief chest compression 02/07/12 & hypotension  . Dental crowns present   . Gallbladder polyp   . GERD (gastroesophageal reflux disease)   . 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 History of kidney stones   . Hypercholesterolemia   . Insomnia   . Lateral meniscal tear 09/2011   a. right knee  . Liver hemangioma   . Mechanical ptosis of bilateral eyelids    upper eyelids  . Medial meniscus tear 09/2011   a. right knee  . Mitral valve prolapse   . Myogenic ptosis    left upper eyelid  . Paroxysmal atrial fibrillation ([place])    a. 09/2011 in setting of post-op knee  . PONV (postoperative nausea and vomiting)    flat line after prophol, hx of bp droppin also  . Positional vertigo   . Stroke ([place])    Left hand Weaker  . Syncope, vasovagal 2005   a. exacerbated by pain/surgical procedures    Surgeries: Procedure(s): LEFT TOTAL KNEE REVISION on 09/12/2020   Consultants:   Discharged Condition: Improved  Hospital Course: Allison Hunt is an 70 y.o. female who was admitted 09/12/2020 for operative treatment ofHistory of revision of total replacement of left knee joint. Patient has severe unremitting pain that  affects sleep, daily activities, and work/hobbies. After pre-op clearance the patient was taken to the operating room on 09/12/2020 and underwent  Procedure(s): LEFT TOTAL KNEE REVISION.    Patient was given perioperative antibiotics:  Anti-infectives (From admission, onward)   Start     Dose/Rate Route Frequency Ordered Stop   09/12/20 1400  ceFAZolin (ANCEF) IVPB 2g/100 mL premix        2 g 200 mL/hr over 30 Minutes Intravenous Every 6 hours 09/12/20 1042 09/12/20 2157   09/12/20 0600  ceFAZolin (ANCEF) IVPB 2g/100 mL premix        2 g 200 mL/hr over 30 Minutes Intravenous On call to O.R. 09/12/20 6503 09/12/20 5465       Patient was given sequential compression devices, early ambulation, and chemoprophylaxis to prevent DVT.  Patient benefited maximally from hospital stay and there were no complications.    Recent vital signs:  Patient Vitals for the past 24 hrs:  BP Temp Temp src Pulse Resp SpO2 Height Weight  09/13/20 0556 (!) 159/81 98.3 F (36.8 C) Oral 71 16 97 % -- --  09/13/20 0204 (!) 161/77 98 F (36.7 C) -- 76 16 95 % -- --  09/12/20 2105 (!) 157/71 98.2 F (36.8 C) Oral 79 17 97 % -- --  09/12/20 1951 (!) 154/79 98.2 F (36.8 C) Oral 83 17 97 % -- --  09/12/20 1731 (!) 156/83 -- -- 80 18 97 % -- --  09/12/20 1235 (!) 142/73 98.6 F (37 C) Oral 76 16 96 % -- --  09/12/20 1054 -- -- -- -- -- -- 5\' 2"  (1.575 m) 68 kg  09/12/20 1053 (!) 155/79 -- -- 82 15 97 % -- --  09/12/20 1030 (!) 154/82 -- -- 86 16 98 % -- --  09/12/20 1020 (!) 144/74 -- -- 71 15 99 % -- --  09/12/20 1010 (!) 157/98 -- -- 80 12 95 % -- --  09/12/20 1000 (!) 161/80 -- -- 81 16 98 % -- --  09/12/20 0950 (!) 158/76 -- -- 79 11 98 % -- --  09/12/20 0940 (!) 151/82 -- -- 71 14 95 % -- --  09/12/20 0930 (!) 129/98 -- -- 80 (!) 21 95 % -- --  09/12/20 0916 (!) 157/73 -- -- 68 17 93 % -- --  09/12/20 0908 (!) 144/71 97.7 F (36.5 C) -- 76 11 98 % -- --     Recent laboratory studies: No results for  input(s): WBC, HGB, HCT, PLT, NA, K, CL, CO2, BUN, CREATININE, GLUCOSE, INR, CALCIUM in the last 72 hours.  Invalid input(s): PT, 2   Discharge Medications:   Allergies as of 09/13/2020      Reactions   Propofol Anaphylaxis, Other (See Comments)   Pt "flat lined" twice when she had propofol administered to her during another surgery - has received propofol since without incident 09/12/20   Demerol Nausea And Vomiting   Tramadol Nausea And Vomiting, Other (See Comments)   Pt must take with food to tolerate medication      Medication List    TAKE these medications   acetaminophen 500 MG tablet Commonly known as: TYLENOL Take 500 mg by mouth every 6 (six) hours as needed for moderate pain.   aspirin 81 MG chewable tablet Chew 1 tablet (81 mg total) by mouth 2 (two) times daily after a meal. What changed: when to take this   atorvastatin 40 MG tablet Commonly known as: LIPITOR Take 80 mg by mouth at bedtime.   calcium carbonate 500 MG chewable tablet Commonly known as: TUMS - dosed in mg elemental calcium Chew 1-2 tablets by mouth 3 (three) times daily as needed for indigestion or heartburn.   losartan 50 MG tablet Commonly known as: COZAAR Take 100 mg by mouth in the morning.   PreviDent 0.2 % Soln Generic drug: SODIUM FLUORIDE (DENTAL RINSE) Take 15 mLs by mouth at bedtime.   Vitamin D 50 MCG (2000 UT) Caps Take 2,000 Units by mouth in the morning.   zaleplon 10 MG capsule Commonly known as: SONATA Take 10 mg by mouth at bedtime and may repeat dose one time if needed.            Durable Medical Equipment  (From admission, onward)         Start     Ordered   09/12/20 1043  DME Walker rolling  Once       Question:  Patient needs a walker to treat with the following condition  Answer:  Primary osteoarthritis of left knee   09/12/20 1042   09/12/20 1043  DME 3 n 1  Once        09/12/20 1042   09/12/20 1043  DME Bedside commode  Once       Question:  Patient  needs a bedside commode to treat with the following condition  Answer:  Primary osteoarthritis of left knee   09/12/20 1042          Diagnostic Studies: DG Chest 2 View  Result Date: 09/02/2020 CLINICAL DATA:  Preoperative study prior to knee surgery. EXAM: CHEST - 2 VIEW COMPARISON:  October 07, 2019 FINDINGS: The heart size and mediastinal contours are within normal limits. Both lungs are clear. The visualized skeletal structures are unremarkable. IMPRESSION: No active cardiopulmonary disease. Electronically Signed   By: Dorise Bullion III M.D   On: 09/02/2020 11:34    Disposition: Discharge disposition: 01-Home or Self Care       Discharge Instructions    Call MD / Call 911   Complete by: As directed    If you experience chest pain or shortness of breath, CALL 911 and be transported to the hospital emergency room.  If you develope a fever above 101 F, pus (white drainage) or increased drainage or redness at the wound, or calf pain, call your surgeon's office.   Call MD / Call 911   Complete by: As directed    If you experience chest pain or shortness of breath, CALL 911 and be transported to the hospital emergency room.  If you develope a fever above 101 F, pus (white drainage) or increased drainage or redness at the wound, or calf pain, call your surgeon's office.   Constipation Prevention   Complete by: As directed    Drink plenty of fluids.  Prune juice may be helpful.  You may use a stool softener, such as Colace (over the counter) 100 mg twice a day.  Use MiraLax (over the counter) for constipation as needed.   Constipation Prevention   Complete by: As directed    Drink plenty of fluids.  Prune juice may be helpful.  You may use a stool softener, such as Colace (over the counter) 100 mg twice a day.  Use MiraLax (over the counter) for constipation as needed.   Diet - low sodium heart healthy   Complete by: As directed    Diet - low sodium heart healthy   Complete by: As  directed    Discharge instructions   Complete by: As directed    INSTRUCTIONS AFTER JOINT REPLACEMENT   Remove items at home which could result in a fall. This includes throw rugs or furniture in walking pathways ICE to the affected joint every three hours while awake for 30 minutes at a time, for at least the first 3-5 days, and then as needed for pain and swelling.  Continue to use ice for pain and swelling. You may notice swelling that will progress down to the foot and ankle.  This is normal after surgery.  Elevate your leg when you are not up walking on it.   Continue to use the breathing machine you got in the hospital (incentive spirometer) which will help keep your temperature down.  It is common for your temperature to cycle up and down following surgery, especially at night when you are not up moving around and exerting yourself.  The breathing machine keeps your lungs expanded and your temperature down.   DIET:  As you were doing prior to hospitalization, we recommend a well-balanced diet.  DRESSING / WOUND CARE / SHOWERING  You may shower 3 days after surgery, but keep the wounds dry during showering.  You may use an occlusive plastic wrap (Press'n Seal for example), NO SOAKING/SUBMERGING IN THE BATHTUB.  If the bandage gets wet, change with a  clean dry gauze.  If the incision gets wet, pat the wound dry with a clean towel.  ACTIVITY  Increase activity slowly as tolerated, but follow the weight bearing instructions below.   No driving for 6 weeks or until further direction given by your physician.  You cannot drive while taking narcotics.  No lifting or carrying greater than 10 lbs. until further directed by your surgeon. Avoid periods of inactivity such as sitting longer than an hour when not asleep. This helps prevent blood clots.  You may return to work once you are authorized by your doctor.     WEIGHT BEARING   Weight bearing as tolerated with assist device (walker,  cane, etc) as directed, use it as long as suggested by your surgeon or therapist, typically at least 4-6 weeks.   EXERCISES  Results after joint replacement surgery are often greatly improved when you follow the exercise, range of motion and muscle strengthening exercises prescribed by your doctor. Safety measures are also important to protect the joint from further injury. Any time any of these exercises cause you to have increased pain or swelling, decrease what you are doing until you are comfortable again and then slowly increase them. If you have problems or questions, call your caregiver or physical therapist for advice.   Rehabilitation is important following a joint replacement. After just a few days of immobilization, the muscles of the leg can become weakened and shrink (atrophy).  These exercises are designed to build up the tone and strength of the thigh and leg muscles and to improve motion. Often times heat used for twenty to thirty minutes before working out will loosen up your tissues and help with improving the range of motion but do not use heat for the first two weeks following surgery (sometimes heat can increase post-operative swelling).   These exercises can be done on a training (exercise) mat, on the floor, on a table or on a bed. Use whatever works the best and is most comfortable for you.    Use music or television while you are exercising so that the exercises are a pleasant break in your day. This will make your life better with the exercises acting as a break in your routine that you can look forward to.   Perform all exercises about fifteen times, three times per day or as directed.  You should exercise both the operative leg and the other leg as well.  Exercises include:   Quad Sets - Tighten up the muscle on the front of the thigh (Quad) and hold for 5-10 seconds.   Straight Leg Raises - With your knee straight (if you were given a brace, keep it on), lift the leg to 60  degrees, hold for 3 seconds, and slowly lower the leg.  Perform this exercise against resistance later as your leg gets stronger.  Leg Slides: Lying on your back, slowly slide your foot toward your buttocks, bending your knee up off the floor (only go as far as is comfortable). Then slowly slide your foot back down until your leg is flat on the floor again.  Angel Wings: Lying on your back spread your legs to the side as far apart as you can without causing discomfort.  Hamstring Strength:  Lying on your back, push your heel against the floor with your leg straight by tightening up the muscles of your buttocks.  Repeat, but this time bend your knee to a comfortable angle, and push your heel against  the floor.  You may put a pillow under the heel to make it more comfortable if necessary.   A rehabilitation program following joint replacement surgery can speed recovery and prevent re-injury in the future due to weakened muscles. Contact your doctor or a physical therapist for more information on knee rehabilitation.    CONSTIPATION  Constipation is defined medically as fewer than three stools per week and severe constipation as less than one stool per week.  Even if you have a regular bowel pattern at home, your normal regimen is likely to be disrupted due to multiple reasons following surgery.  Combination of anesthesia, postoperative narcotics, change in appetite and fluid intake all can affect your bowels.   YOU MUST use at least one of the following options; they are listed in order of increasing strength to get the job done.  They are all available over the counter, and you may need to use some, POSSIBLY even all of these options:    Drink plenty of fluids (prune juice may be helpful) and high fiber foods Colace 100 mg by mouth twice a day  Senokot for constipation as directed and as needed Dulcolax (bisacodyl), take with full glass of water  Miralax (polyethylene glycol) once or twice a day as  needed.  If you have tried all these things and are unable to have a bowel movement in the first 3-4 days after surgery call either your surgeon or your primary doctor.    If you experience loose stools or diarrhea, hold the medications until you stool forms back up.  If your symptoms do not get better within 1 week or if they get worse, check with your doctor.  If you experience "the worst abdominal pain ever" or develop nausea or vomiting, please contact the office immediately for further recommendations for treatment.   ITCHING:  If you experience itching with your medications, try taking only a single pain pill, or even half a pain pill at a time.  You can also use Benadryl over the counter for itching or also to help with sleep.   TED HOSE STOCKINGS:  Use stockings on both legs until for at least 2 weeks or as directed by physician office. They may be removed at night for sleeping.  MEDICATIONS:  See your medication summary on the "After Visit Summary" that nursing will review with you.  You may have some home medications which will be placed on hold until you complete the course of blood thinner medication.  It is important for you to complete the blood thinner medication as prescribed.  PRECAUTIONS:  If you experience chest pain or shortness of breath - call 911 immediately for transfer to the hospital emergency department.   If you develop a fever greater that 101 F, purulent drainage from wound, increased redness or drainage from wound, foul odor from the wound/dressing, or calf pain - CONTACT YOUR SURGEON.                                                   FOLLOW-UP APPOINTMENTS:  If you do not already have a post-op appointment, please call the office for an appointment to be seen by your surgeon.  Guidelines for how soon to be seen are listed in your "After Visit Summary", but are typically between 1-4 weeks after surgery.  OTHER INSTRUCTIONS:  Knee Replacement:  Do not place pillow  under knee, focus on keeping the knee straight while resting. CPM instructions: 0-90 degrees, 2 hours in the morning, 2 hours in the afternoon, and 2 hours in the evening. Place foam block, curve side up under heel at all times except when in CPM or when walking.  DO NOT modify, tear, cut, or change the foam block in any way.  POST-OPERATIVE OPIOID TAPER INSTRUCTIONS: It is important to wean off of your opioid medication as soon as possible. If you do not need pain medication after your surgery it is ok to stop day one. Opioids include: Codeine, Hydrocodone(Norco, Vicodin), Oxycodone(Percocet, oxycontin) and hydromorphone amongst others.  Long term and even short term use of opiods can cause: Increased pain response Dependence Constipation Depression Respiratory depression And more.  Withdrawal symptoms can include Flu like symptoms Nausea, vomiting And more Techniques to manage these symptoms Hydrate well Eat regular healthy meals Stay active Use relaxation techniques(deep breathing, meditating, yoga) Do Not substitute Alcohol to help with tapering If you have been on opioids for less than two weeks and do not have pain than it is ok to stop all together.  Plan to wean off of opioids This plan should start within one week post op of your joint replacement. Maintain the same interval or time between taking each dose and first decrease the dose.  Cut the total daily intake of opioids by one tablet each day Next start to increase the time between doses. The last dose that should be eliminated is the evening dose.     MAKE SURE YOU:  Understand these instructions.  Get help right away if you are not doing well or get worse.    Thank you for letting us be a part of your medical care team.  It is a privilege we respect greatly.  We hope these instructions will help you stay on track for a fast and full recovery!   Discharge instructions   Complete by: As directed    INSTRUCTIONS  AFTER JOINT REPLACEMENT   Remove items at home which could result in a fall. This includes throw rugs or furniture in walking pathways ICE to the affected joint every three hours while awake for 30 minutes at a time, for at least the first 3-5 days, and then as needed for pain and swelling.  Continue to use ice for pain and swelling. You may notice swelling that will progress down to the foot and ankle.  This is normal after surgery.  Elevate your leg when you are not up walking on it.   Continue to use the breathing machine you got in the hospital (incentive spirometer) which will help keep your temperature down.  It is common for your temperature to cycle up and down following surgery, especially at night when you are not up moving around and exerting yourself.  The breathing machine keeps your lungs expanded and your temperature down.   DIET:  As you were doing prior to hospitalization, we recommend a well-balanced diet.  DRESSING / WOUND CARE / SHOWERING  You may shower 3 days after surgery, but keep the wounds dry during showering.  You may use an occlusive plastic wrap (Press'n Seal for example), NO SOAKING/SUBMERGING IN THE BATHTUB.  If the bandage gets wet, change with a clean dry gauze.  If the incision gets wet, pat the wound dry with a clean towel.  ACTIVITY  Increase activity slowly as tolerated, but follow the weight bearing  instructions below.   No driving for 6 weeks or until further direction given by your physician.  You cannot drive while taking narcotics.  No lifting or carrying greater than 10 lbs. until further directed by your surgeon. Avoid periods of inactivity such as sitting longer than an hour when not asleep. This helps prevent blood clots.  You may return to work once you are authorized by your doctor.     WEIGHT BEARING   Weight bearing as tolerated with assist device (walker, cane, etc) as directed, use it as long as suggested by your surgeon or therapist,  typically at least 4-6 weeks.   EXERCISES  Results after joint replacement surgery are often greatly improved when you follow the exercise, range of motion and muscle strengthening exercises prescribed by your doctor. Safety measures are also important to protect the joint from further injury. Any time any of these exercises cause you to have increased pain or swelling, decrease what you are doing until you are comfortable again and then slowly increase them. If you have problems or questions, call your caregiver or physical therapist for advice.   Rehabilitation is important following a joint replacement. After just a few days of immobilization, the muscles of the leg can become weakened and shrink (atrophy).  These exercises are designed to build up the tone and strength of the thigh and leg muscles and to improve motion. Often times heat used for twenty to thirty minutes before working out will loosen up your tissues and help with improving the range of motion but do not use heat for the first two weeks following surgery (sometimes heat can increase post-operative swelling).   These exercises can be done on a training (exercise) mat, on the floor, on a table or on a bed. Use whatever works the best and is most comfortable for you.    Use music or television while you are exercising so that the exercises are a pleasant break in your day. This will make your life better with the exercises acting as a break in your routine that you can look forward to.   Perform all exercises about fifteen times, three times per day or as directed.  You should exercise both the operative leg and the other leg as well.  Exercises include:   Quad Sets - Tighten up the muscle on the front of the thigh (Quad) and hold for 5-10 seconds.   Straight Leg Raises - With your knee straight (if you were given a brace, keep it on), lift the leg to 60 degrees, hold for 3 seconds, and slowly lower the leg.  Perform this exercise  against resistance later as your leg gets stronger.  Leg Slides: Lying on your back, slowly slide your foot toward your buttocks, bending your knee up off the floor (only go as far as is comfortable). Then slowly slide your foot back down until your leg is flat on the floor again.  Angel Wings: Lying on your back spread your legs to the side as far apart as you can without causing discomfort.  Hamstring Strength:  Lying on your back, push your heel against the floor with your leg straight by tightening up the muscles of your buttocks.  Repeat, but this time bend your knee to a comfortable angle, and push your heel against the floor.  You may put a pillow under the heel to make it more comfortable if necessary.   A rehabilitation program following joint replacement surgery can speed recovery  and prevent re-injury in the future due to weakened muscles. Contact your doctor or a physical therapist for more information on knee rehabilitation.    CONSTIPATION  Constipation is defined medically as fewer than three stools per week and severe constipation as less than one stool per week.  Even if you have a regular bowel pattern at home, your normal regimen is likely to be disrupted due to multiple reasons following surgery.  Combination of anesthesia, postoperative narcotics, change in appetite and fluid intake all can affect your bowels.   YOU MUST use at least one of the following options; they are listed in order of increasing strength to get the job done.  They are all available over the counter, and you may need to use some, POSSIBLY even all of these options:    Drink plenty of fluids (prune juice may be helpful) and high fiber foods Colace 100 mg by mouth twice a day  Senokot for constipation as directed and as needed Dulcolax (bisacodyl), take with full glass of water  Miralax (polyethylene glycol) once or twice a day as needed.  If you have tried all these things and are unable to have a bowel  movement in the first 3-4 days after surgery call either your surgeon or your primary doctor.    If you experience loose stools or diarrhea, hold the medications until you stool forms back up.  If your symptoms do not get better within 1 week or if they get worse, check with your doctor.  If you experience "the worst abdominal pain ever" or develop nausea or vomiting, please contact the office immediately for further recommendations for treatment.   ITCHING:  If you experience itching with your medications, try taking only a single pain pill, or even half a pain pill at a time.  You can also use Benadryl over the counter for itching or also to help with sleep.   TED HOSE STOCKINGS:  Use stockings on both legs until for at least 2 weeks or as directed by physician office. They may be removed at night for sleeping.  MEDICATIONS:  See your medication summary on the "After Visit Summary" that nursing will review with you.  You may have some home medications which will be placed on hold until you complete the course of blood thinner medication.  It is important for you to complete the blood thinner medication as prescribed.  PRECAUTIONS:  If you experience chest pain or shortness of breath - call 911 immediately for transfer to the hospital emergency department.   If you develop a fever greater that 101 F, purulent drainage from wound, increased redness or drainage from wound, foul odor from the wound/dressing, or calf pain - CONTACT YOUR SURGEON.                                                   FOLLOW-UP APPOINTMENTS:  If you do not already have a post-op appointment, please call the office for an appointment to be seen by your surgeon.  Guidelines for how soon to be seen are listed in your "After Visit Summary", but are typically between 1-4 weeks after surgery.  OTHER INSTRUCTIONS:   Knee Replacement:  Do not place pillow under knee, focus on keeping the knee straight while resting. CPM  instructions: 0-90 degrees, 2 hours in the morning, 2  hours in the afternoon, and 2 hours in the evening. Place foam block, curve side up under heel at all times except when in CPM or when walking.  DO NOT modify, tear, cut, or change the foam block in any way.  POST-OPERATIVE OPIOID TAPER INSTRUCTIONS: It is important to wean off of your opioid medication as soon as possible. If you do not need pain medication after your surgery it is ok to stop day one. Opioids include: Codeine, Hydrocodone(Norco, Vicodin), Oxycodone(Percocet, oxycontin) and hydromorphone amongst others.  Long term and even short term use of opiods can cause: Increased pain response Dependence Constipation Depression Respiratory depression And more.  Withdrawal symptoms can include Flu like symptoms Nausea, vomiting And more Techniques to manage these symptoms Hydrate well Eat regular healthy meals Stay active Use relaxation techniques(deep breathing, meditating, yoga) Do Not substitute Alcohol to help with tapering If you have been on opioids for less than two weeks and do not have pain than it is ok to stop all together.  Plan to wean off of opioids This plan should start within one week post op of your joint replacement. Maintain the same interval or time between taking each dose and first decrease the dose.  Cut the total daily intake of opioids by one tablet each day Next start to increase the time between doses. The last dose that should be eliminated is the evening dose.     MAKE SURE YOU:  Understand these instructions.  Get help right away if you are not doing well or get worse.    Thank you for letting us be a part of your medical care team.  It is a privilege we respect greatly.  We hope these instructions will help you stay on track for a fast and full recovery!   Increase activity slowly as tolerated   Complete by: As directed    Increase activity slowly as tolerated   Complete by: As  directed        Follow-up Information    Melrose Nakayama, MD. Schedule an appointment as soon as possible for a visit in 2 weeks.   Specialty: Orthopedic Surgery Contact information: [place] [address] [phone]                Signed: Larwance Sachs Geniyah Eischeid 09/13/2020, 8:20 AM

## 2020-09-13 NOTE — Progress Notes (Signed)
Physical Therapy Treatment Patient Details Name: Allison Hunt MRN: [id number] DOB: 1950/07/14 Today's Date: 09/13/2020    History of Present Illness 70 y.o. female, has a history of pain and functional disability in the left knee(s) due to Arthroplasty with instability and patient. admitted for L TKA revision 09/12/20. PMH: CVA, vertigo, syncope    PT Comments    Pt reports feeling well just tired. Pt is ready to d/c home with family/friend assist from PT standpoint. Reviewed knee precautions and HEP. Pt is familiar from prior surgery. See below for mobility details.   Follow Up Recommendations  Follow surgeon's recommendation for DC plan and follow-up therapies     Equipment Recommendations  None recommended by PT    Recommendations for Other Services       Precautions / Restrictions Precautions Precautions: Fall;Knee Restrictions Weight Bearing Restrictions: No LLE Weight Bearing: Weight bearing as tolerated    Mobility  Bed Mobility Overal bed mobility: Needs Assistance Bed Mobility: Supine to Sit     Supine to sit: Modified independent (Device/Increase time)          Transfers Overall transfer level: Needs assistance Equipment used: Rolling walker (2 wheeled) Transfers: Sit to/from Stand Sit to Stand: Supervision         General transfer comment: cues for hand placement  Ambulation/Gait Ambulation/Gait assistance: Supervision;Modified independent (Device/Increase time) Gait Distance (Feet): 80 Feet Assistive device: Rolling walker (2 wheeled) Gait Pattern/deviations: Step-to pattern;Decreased stance time - left     General Gait Details: cues for sequence, RW position (amb with rollator at home )   Stairs             Wheelchair Mobility    Modified Rankin (Stroke Patients Only)       Balance                                            Cognition Arousal/Alertness: Awake/alert Behavior During Therapy: WFL for tasks  assessed/performed Overall Cognitive Status: Within Functional Limits for tasks assessed                                        Exercises Total Joint Exercises Ankle Circles/Pumps: AROM;Both;10 reps Quad Sets: 5 reps;Both;AROM Heel Slides: AROM;Left;10 reps Straight Leg Raises: AROM;Left;10 reps Goniometric ROM: grossly 5 to 75 degrees L knee flexion    General Comments        Pertinent Vitals/Pain Pain Assessment: 0-10 Pain Score: 3  Pain Location: L knee Pain Descriptors / Indicators: Discomfort;Sore Pain Intervention(s): Limited activity within patient's tolerance;Monitored during session;Premedicated before session;Repositioned;Ice applied    Home Living                      Prior Function            PT Goals (current goals can now be found in the care plan section) Acute Rehab PT Goals Patient Stated Goal: go home Wednesday, have less knee pain PT Goal Formulation: With patient Time For Goal Achievement: 09/19/20 Potential to Achieve Goals: Good Progress towards PT goals: Progressing toward goals    Frequency    7X/week      PT Plan Current plan remains appropriate    Co-evaluation  AM-PAC PT "6 Clicks" Mobility   Outcome Measure  Help needed turning from your back to your side while in a flat bed without using bedrails?: None Help needed moving from lying on your back to sitting on the side of a flat bed without using bedrails?: None Help needed moving to and from a bed to a chair (including a wheelchair)?: None Help needed standing up from a chair using your arms (e.g., wheelchair or bedside chair)?: None Help needed to walk in hospital room?: A Little Help needed climbing 3-5 steps with a railing? : A Little 6 Click Score: 22    End of Session Equipment Utilized During Treatment: Gait belt Activity Tolerance: Patient tolerated treatment well Patient left: in bed;with call bell/phone within reach;with  bed alarm set Nurse Communication: Mobility status PT Visit Diagnosis: Difficulty in walking, not elsewhere classified (R26.2)     Time: 1012-1030 PT Time Calculation (min) (ACUTE ONLY): 18 min  Charges:  $Gait Training: 8-22 mins                     Baxter Flattery, PT  Acute Rehab Dept ([place]) [phone] Pager [phone]  09/13/2020    [facility] 09/13/2020, 10:35 AM

## 2020-09-13 NOTE — TOC Transition Note (Signed)
Transition of Care [facility]) - CM/SW Discharge Note   Patient Details  Name: Rainee Sweatt MRN: [id number] Date of Birth: 06-Aug-1950  Transition of Care [facility]) CM/SW Contact:  Lennart Pall, LCSW Phone Number: 09/13/2020, 10:22 AM   Clinical Narrative:    Met with pt to review dc arrangements.  Pt confirms that she has all needed DME at home from prior surgeries.  Plans to have OPPT at [facility].  No further TOC needs.   Final next level of care: OP Rehab Barriers to Discharge: No Barriers Identified   Patient Goals and CMS Choice Patient states their goals for this hospitalization and ongoing recovery are:: return home      Discharge Placement                       Discharge Plan and Services                DME Arranged: N/A DME Agency: NA                  Social Determinants of Health (SDOH) Interventions     Readmission Risk Interventions Readmission Risk Prevention Plan 09/13/2020  Post Dischage Appt Complete  Medication Screening Complete  Transportation Screening Complete  Some recent data might be hidden

## 2020-09-14 ENCOUNTER — Other Ambulatory Visit (HOSPITAL_COMMUNITY): Payer: Self-pay

## 2020-09-20 DIAGNOSIS — M6281 Muscle weakness (generalized): Secondary | ICD-10-CM | POA: Diagnosis not present

## 2020-09-20 DIAGNOSIS — M25662 Stiffness of left knee, not elsewhere classified: Secondary | ICD-10-CM | POA: Diagnosis not present

## 2020-09-20 DIAGNOSIS — Z96652 Presence of left artificial knee joint: Secondary | ICD-10-CM | POA: Diagnosis not present

## 2020-09-25 DIAGNOSIS — Z96651 Presence of right artificial knee joint: Secondary | ICD-10-CM | POA: Diagnosis not present

## 2020-09-25 DIAGNOSIS — M1712 Unilateral primary osteoarthritis, left knee: Secondary | ICD-10-CM | POA: Diagnosis not present

## 2020-09-25 DIAGNOSIS — Z9889 Other specified postprocedural states: Secondary | ICD-10-CM | POA: Diagnosis not present

## 2020-09-27 DIAGNOSIS — M6281 Muscle weakness (generalized): Secondary | ICD-10-CM | POA: Diagnosis not present

## 2020-09-27 DIAGNOSIS — Z96652 Presence of left artificial knee joint: Secondary | ICD-10-CM | POA: Diagnosis not present

## 2020-09-27 DIAGNOSIS — M25662 Stiffness of left knee, not elsewhere classified: Secondary | ICD-10-CM | POA: Diagnosis not present

## 2020-10-03 DIAGNOSIS — M6281 Muscle weakness (generalized): Secondary | ICD-10-CM | POA: Diagnosis not present

## 2020-10-03 DIAGNOSIS — Z96652 Presence of left artificial knee joint: Secondary | ICD-10-CM | POA: Diagnosis not present

## 2020-10-03 DIAGNOSIS — M25662 Stiffness of left knee, not elsewhere classified: Secondary | ICD-10-CM | POA: Diagnosis not present

## 2020-10-05 DIAGNOSIS — Z96652 Presence of left artificial knee joint: Secondary | ICD-10-CM | POA: Diagnosis not present

## 2020-10-05 DIAGNOSIS — M6281 Muscle weakness (generalized): Secondary | ICD-10-CM | POA: Diagnosis not present

## 2020-10-05 DIAGNOSIS — M25662 Stiffness of left knee, not elsewhere classified: Secondary | ICD-10-CM | POA: Diagnosis not present

## 2020-10-10 DIAGNOSIS — Z96652 Presence of left artificial knee joint: Secondary | ICD-10-CM | POA: Diagnosis not present

## 2020-10-10 DIAGNOSIS — M6281 Muscle weakness (generalized): Secondary | ICD-10-CM | POA: Diagnosis not present

## 2020-10-10 DIAGNOSIS — M25662 Stiffness of left knee, not elsewhere classified: Secondary | ICD-10-CM | POA: Diagnosis not present

## 2020-10-12 DIAGNOSIS — M25662 Stiffness of left knee, not elsewhere classified: Secondary | ICD-10-CM | POA: Diagnosis not present

## 2020-10-12 DIAGNOSIS — M6281 Muscle weakness (generalized): Secondary | ICD-10-CM | POA: Diagnosis not present

## 2020-10-12 DIAGNOSIS — Z96652 Presence of left artificial knee joint: Secondary | ICD-10-CM | POA: Diagnosis not present

## 2020-10-17 DIAGNOSIS — Z96652 Presence of left artificial knee joint: Secondary | ICD-10-CM | POA: Diagnosis not present

## 2020-10-17 DIAGNOSIS — M25662 Stiffness of left knee, not elsewhere classified: Secondary | ICD-10-CM | POA: Diagnosis not present

## 2020-10-17 DIAGNOSIS — M6281 Muscle weakness (generalized): Secondary | ICD-10-CM | POA: Diagnosis not present

## 2020-10-18 DIAGNOSIS — Z9889 Other specified postprocedural states: Secondary | ICD-10-CM | POA: Diagnosis not present

## 2020-10-19 DIAGNOSIS — M6281 Muscle weakness (generalized): Secondary | ICD-10-CM | POA: Diagnosis not present

## 2020-10-19 DIAGNOSIS — Z96652 Presence of left artificial knee joint: Secondary | ICD-10-CM | POA: Diagnosis not present

## 2020-10-19 DIAGNOSIS — M25662 Stiffness of left knee, not elsewhere classified: Secondary | ICD-10-CM | POA: Diagnosis not present

## 2020-10-24 DIAGNOSIS — Z96652 Presence of left artificial knee joint: Secondary | ICD-10-CM | POA: Diagnosis not present

## 2020-10-24 DIAGNOSIS — M6281 Muscle weakness (generalized): Secondary | ICD-10-CM | POA: Diagnosis not present

## 2020-10-24 DIAGNOSIS — Z8673 Personal history of transient ischemic attack (TIA), and cerebral infarction without residual deficits: Secondary | ICD-10-CM | POA: Diagnosis not present

## 2020-10-24 DIAGNOSIS — M25662 Stiffness of left knee, not elsewhere classified: Secondary | ICD-10-CM | POA: Diagnosis not present

## 2020-10-26 DIAGNOSIS — M6281 Muscle weakness (generalized): Secondary | ICD-10-CM | POA: Diagnosis not present

## 2020-10-26 DIAGNOSIS — Z96652 Presence of left artificial knee joint: Secondary | ICD-10-CM | POA: Diagnosis not present

## 2020-10-26 DIAGNOSIS — M25662 Stiffness of left knee, not elsewhere classified: Secondary | ICD-10-CM | POA: Diagnosis not present

## 2020-10-27 DIAGNOSIS — I471 Supraventricular tachycardia: Secondary | ICD-10-CM | POA: Diagnosis not present

## 2020-10-31 DIAGNOSIS — M6281 Muscle weakness (generalized): Secondary | ICD-10-CM | POA: Diagnosis not present

## 2020-10-31 DIAGNOSIS — Z96652 Presence of left artificial knee joint: Secondary | ICD-10-CM | POA: Diagnosis not present

## 2020-10-31 DIAGNOSIS — M25662 Stiffness of left knee, not elsewhere classified: Secondary | ICD-10-CM | POA: Diagnosis not present

## 2020-11-07 DIAGNOSIS — M25662 Stiffness of left knee, not elsewhere classified: Secondary | ICD-10-CM | POA: Diagnosis not present

## 2020-11-07 DIAGNOSIS — Z96652 Presence of left artificial knee joint: Secondary | ICD-10-CM | POA: Diagnosis not present

## 2020-11-07 DIAGNOSIS — M6281 Muscle weakness (generalized): Secondary | ICD-10-CM | POA: Diagnosis not present

## 2020-11-09 DIAGNOSIS — M25662 Stiffness of left knee, not elsewhere classified: Secondary | ICD-10-CM | POA: Diagnosis not present

## 2020-11-09 DIAGNOSIS — M6281 Muscle weakness (generalized): Secondary | ICD-10-CM | POA: Diagnosis not present

## 2020-11-09 DIAGNOSIS — Z96652 Presence of left artificial knee joint: Secondary | ICD-10-CM | POA: Diagnosis not present

## 2020-11-15 DIAGNOSIS — M25662 Stiffness of left knee, not elsewhere classified: Secondary | ICD-10-CM | POA: Diagnosis not present

## 2020-11-15 DIAGNOSIS — M6281 Muscle weakness (generalized): Secondary | ICD-10-CM | POA: Diagnosis not present

## 2020-11-15 DIAGNOSIS — Z96652 Presence of left artificial knee joint: Secondary | ICD-10-CM | POA: Diagnosis not present

## 2020-11-22 DIAGNOSIS — M6281 Muscle weakness (generalized): Secondary | ICD-10-CM | POA: Diagnosis not present

## 2020-11-22 DIAGNOSIS — Z96652 Presence of left artificial knee joint: Secondary | ICD-10-CM | POA: Diagnosis not present

## 2020-11-22 DIAGNOSIS — M25662 Stiffness of left knee, not elsewhere classified: Secondary | ICD-10-CM | POA: Diagnosis not present

## 2020-11-27 DIAGNOSIS — Z96652 Presence of left artificial knee joint: Secondary | ICD-10-CM | POA: Diagnosis not present

## 2020-11-27 DIAGNOSIS — M25662 Stiffness of left knee, not elsewhere classified: Secondary | ICD-10-CM | POA: Diagnosis not present

## 2020-11-27 DIAGNOSIS — M6281 Muscle weakness (generalized): Secondary | ICD-10-CM | POA: Diagnosis not present

## 2020-12-06 DIAGNOSIS — R531 Weakness: Secondary | ICD-10-CM | POA: Diagnosis not present

## 2020-12-06 DIAGNOSIS — G8929 Other chronic pain: Secondary | ICD-10-CM | POA: Diagnosis not present

## 2020-12-06 DIAGNOSIS — M170 Bilateral primary osteoarthritis of knee: Secondary | ICD-10-CM | POA: Diagnosis not present

## 2020-12-06 DIAGNOSIS — E782 Mixed hyperlipidemia: Secondary | ICD-10-CM | POA: Diagnosis not present

## 2020-12-06 DIAGNOSIS — Z8673 Personal history of transient ischemic attack (TIA), and cerebral infarction without residual deficits: Secondary | ICD-10-CM | POA: Diagnosis not present

## 2020-12-06 DIAGNOSIS — M545 Low back pain, unspecified: Secondary | ICD-10-CM | POA: Diagnosis not present

## 2020-12-06 DIAGNOSIS — R350 Frequency of micturition: Secondary | ICD-10-CM | POA: Diagnosis not present

## 2020-12-06 DIAGNOSIS — I10 Essential (primary) hypertension: Secondary | ICD-10-CM | POA: Diagnosis not present

## 2020-12-06 DIAGNOSIS — F5101 Primary insomnia: Secondary | ICD-10-CM | POA: Diagnosis not present

## 2020-12-08 DIAGNOSIS — M25662 Stiffness of left knee, not elsewhere classified: Secondary | ICD-10-CM | POA: Diagnosis not present

## 2020-12-08 DIAGNOSIS — Z96652 Presence of left artificial knee joint: Secondary | ICD-10-CM | POA: Diagnosis not present

## 2020-12-08 DIAGNOSIS — M6281 Muscle weakness (generalized): Secondary | ICD-10-CM | POA: Diagnosis not present

## 2020-12-08 DIAGNOSIS — E782 Mixed hyperlipidemia: Secondary | ICD-10-CM | POA: Diagnosis not present

## 2020-12-08 DIAGNOSIS — Z9889 Other specified postprocedural states: Secondary | ICD-10-CM | POA: Diagnosis not present

## 2020-12-08 DIAGNOSIS — M545 Low back pain, unspecified: Secondary | ICD-10-CM | POA: Diagnosis not present

## 2020-12-12 ENCOUNTER — Other Ambulatory Visit (HOSPITAL_COMMUNITY): Payer: Self-pay | Admitting: Orthopaedic Surgery

## 2020-12-12 DIAGNOSIS — M6281 Muscle weakness (generalized): Secondary | ICD-10-CM | POA: Diagnosis not present

## 2020-12-12 DIAGNOSIS — Z96652 Presence of left artificial knee joint: Secondary | ICD-10-CM | POA: Diagnosis not present

## 2020-12-12 DIAGNOSIS — M25662 Stiffness of left knee, not elsewhere classified: Secondary | ICD-10-CM | POA: Diagnosis not present

## 2020-12-12 DIAGNOSIS — M545 Low back pain, unspecified: Secondary | ICD-10-CM

## 2020-12-14 DIAGNOSIS — M25662 Stiffness of left knee, not elsewhere classified: Secondary | ICD-10-CM | POA: Diagnosis not present

## 2020-12-14 DIAGNOSIS — M6281 Muscle weakness (generalized): Secondary | ICD-10-CM | POA: Diagnosis not present

## 2020-12-14 DIAGNOSIS — Z96652 Presence of left artificial knee joint: Secondary | ICD-10-CM | POA: Diagnosis not present

## 2020-12-17 ENCOUNTER — Ambulatory Visit (HOSPITAL_COMMUNITY)
Admission: RE | Admit: 2020-12-17 | Discharge: 2020-12-17 | Disposition: A | Discharge status: Home / Self Care | Payer: PPO | Source: Ambulatory Visit | Attending: Orthopaedic Surgery | Admitting: Orthopaedic Surgery

## 2020-12-17 ENCOUNTER — Other Ambulatory Visit: Payer: Self-pay

## 2020-12-17 DIAGNOSIS — M545 Low back pain, unspecified: Secondary | ICD-10-CM | POA: Insufficient documentation

## 2020-12-19 DIAGNOSIS — Z96652 Presence of left artificial knee joint: Secondary | ICD-10-CM | POA: Diagnosis not present

## 2020-12-19 DIAGNOSIS — M6281 Muscle weakness (generalized): Secondary | ICD-10-CM | POA: Diagnosis not present

## 2020-12-19 DIAGNOSIS — M25662 Stiffness of left knee, not elsewhere classified: Secondary | ICD-10-CM | POA: Diagnosis not present

## 2020-12-21 DIAGNOSIS — M25662 Stiffness of left knee, not elsewhere classified: Secondary | ICD-10-CM | POA: Diagnosis not present

## 2020-12-21 DIAGNOSIS — M6281 Muscle weakness (generalized): Secondary | ICD-10-CM | POA: Diagnosis not present

## 2020-12-21 DIAGNOSIS — Z96652 Presence of left artificial knee joint: Secondary | ICD-10-CM | POA: Diagnosis not present

## 2020-12-26 DIAGNOSIS — M6281 Muscle weakness (generalized): Secondary | ICD-10-CM | POA: Diagnosis not present

## 2020-12-26 DIAGNOSIS — M25662 Stiffness of left knee, not elsewhere classified: Secondary | ICD-10-CM | POA: Diagnosis not present

## 2020-12-26 DIAGNOSIS — Z96652 Presence of left artificial knee joint: Secondary | ICD-10-CM | POA: Diagnosis not present

## 2020-12-28 DIAGNOSIS — M25662 Stiffness of left knee, not elsewhere classified: Secondary | ICD-10-CM | POA: Diagnosis not present

## 2020-12-28 DIAGNOSIS — M6281 Muscle weakness (generalized): Secondary | ICD-10-CM | POA: Diagnosis not present

## 2020-12-28 DIAGNOSIS — Z96652 Presence of left artificial knee joint: Secondary | ICD-10-CM | POA: Diagnosis not present

## 2020-12-29 DIAGNOSIS — M47816 Spondylosis without myelopathy or radiculopathy, lumbar region: Secondary | ICD-10-CM | POA: Diagnosis not present

## 2021-01-02 DIAGNOSIS — M25662 Stiffness of left knee, not elsewhere classified: Secondary | ICD-10-CM | POA: Diagnosis not present

## 2021-01-02 DIAGNOSIS — Z96652 Presence of left artificial knee joint: Secondary | ICD-10-CM | POA: Diagnosis not present

## 2021-01-02 DIAGNOSIS — M6281 Muscle weakness (generalized): Secondary | ICD-10-CM | POA: Diagnosis not present

## 2021-01-04 DIAGNOSIS — M6281 Muscle weakness (generalized): Secondary | ICD-10-CM | POA: Diagnosis not present

## 2021-01-04 DIAGNOSIS — M25662 Stiffness of left knee, not elsewhere classified: Secondary | ICD-10-CM | POA: Diagnosis not present

## 2021-01-04 DIAGNOSIS — Z96652 Presence of left artificial knee joint: Secondary | ICD-10-CM | POA: Diagnosis not present

## 2021-01-09 DIAGNOSIS — Z96652 Presence of left artificial knee joint: Secondary | ICD-10-CM | POA: Diagnosis not present

## 2021-01-09 DIAGNOSIS — M6281 Muscle weakness (generalized): Secondary | ICD-10-CM | POA: Diagnosis not present

## 2021-01-09 DIAGNOSIS — M25662 Stiffness of left knee, not elsewhere classified: Secondary | ICD-10-CM | POA: Diagnosis not present

## 2021-01-11 DIAGNOSIS — R413 Other amnesia: Secondary | ICD-10-CM | POA: Diagnosis not present

## 2021-01-11 DIAGNOSIS — Z8673 Personal history of transient ischemic attack (TIA), and cerebral infarction without residual deficits: Secondary | ICD-10-CM | POA: Diagnosis not present

## 2021-01-12 DIAGNOSIS — M6281 Muscle weakness (generalized): Secondary | ICD-10-CM | POA: Diagnosis not present

## 2021-01-12 DIAGNOSIS — Z96652 Presence of left artificial knee joint: Secondary | ICD-10-CM | POA: Diagnosis not present

## 2021-01-12 DIAGNOSIS — M25662 Stiffness of left knee, not elsewhere classified: Secondary | ICD-10-CM | POA: Diagnosis not present

## 2021-01-16 DIAGNOSIS — M6281 Muscle weakness (generalized): Secondary | ICD-10-CM | POA: Diagnosis not present

## 2021-01-16 DIAGNOSIS — Z96652 Presence of left artificial knee joint: Secondary | ICD-10-CM | POA: Diagnosis not present

## 2021-01-16 DIAGNOSIS — M25662 Stiffness of left knee, not elsewhere classified: Secondary | ICD-10-CM | POA: Diagnosis not present

## 2021-01-19 DIAGNOSIS — M25662 Stiffness of left knee, not elsewhere classified: Secondary | ICD-10-CM | POA: Diagnosis not present

## 2021-01-19 DIAGNOSIS — M6281 Muscle weakness (generalized): Secondary | ICD-10-CM | POA: Diagnosis not present

## 2021-01-19 DIAGNOSIS — Z96652 Presence of left artificial knee joint: Secondary | ICD-10-CM | POA: Diagnosis not present

## 2021-01-23 DIAGNOSIS — Z96652 Presence of left artificial knee joint: Secondary | ICD-10-CM | POA: Diagnosis not present

## 2021-01-23 DIAGNOSIS — M6281 Muscle weakness (generalized): Secondary | ICD-10-CM | POA: Diagnosis not present

## 2021-01-23 DIAGNOSIS — M25662 Stiffness of left knee, not elsewhere classified: Secondary | ICD-10-CM | POA: Diagnosis not present

## 2021-01-25 DIAGNOSIS — M1711 Unilateral primary osteoarthritis, right knee: Secondary | ICD-10-CM | POA: Diagnosis not present

## 2021-01-30 DIAGNOSIS — M1711 Unilateral primary osteoarthritis, right knee: Secondary | ICD-10-CM | POA: Diagnosis not present

## 2021-02-01 ENCOUNTER — Ambulatory Visit (INDEPENDENT_AMBULATORY_CARE_PROVIDER_SITE_OTHER): Payer: PPO | Admitting: Otolaryngology

## 2021-02-01 ENCOUNTER — Other Ambulatory Visit: Payer: Self-pay

## 2021-02-01 DIAGNOSIS — H6123 Impacted cerumen, bilateral: Secondary | ICD-10-CM | POA: Diagnosis not present

## 2021-02-01 DIAGNOSIS — M1711 Unilateral primary osteoarthritis, right knee: Secondary | ICD-10-CM | POA: Diagnosis not present

## 2021-02-01 NOTE — Progress Notes (Signed)
HPI: Allison Hunt is a 70 y.o. female who presents for evaluation of wax buildup in her ears which is always worse on the left side which she has a narrower opening of the ear canal.  She used to be cleaned about every 6 months.  However because of some medical issues the last time she was cleaned was a little over a year ago.  Past Medical History:  Diagnosis Date   Arthritis    osteoarthritis both knees   Cardiac asystole ([place])    a.  Felt to be 2/2 high vagal tone in setting of knee surgery;  b. 09/2011 Echo: EF 60-65%.   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 right knee - s/p Knee surgery [id number]   Complication of anesthesia    a. 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 right knee   Liver hemangioma    Mechanical ptosis of bilateral eyelids    upper eyelids   Medial meniscus tear 09/2011   a. right knee   Mitral valve prolapse    Myogenic ptosis    left upper eyelid   Paroxysmal atrial fibrillation ([place])    a. 09/2011 in setting of post-op knee   PONV (postoperative nausea and vomiting)    flat line after prophol, hx of bp droppin also   Positional vertigo    Stroke ([place])    Left hand Weaker   Syncope, vasovagal 2005   a. exacerbated by pain/surgical procedures   Past Surgical History:  Procedure Laterality Date   BROW LIFT Bilateral 04/16/2018   Procedure: BLEPHAROPLASTY BOTH UPPER LIDS;  Surgeon: [name], MD;  Location: [facility];  Service: Ophthalmology;  Laterality: Bilateral;   COLONOSCOPY WITH PROPOFOL N/A 02/19/2017   Procedure:  COLONOSCOPY WITH PROPOFOL;  Surgeon: Arta Silence, MD;  Location: [facility];  Service: Endoscopy;  Laterality: N/A;   DILATION AND CURETTAGE OF UTERUS     KNEE ARTHROSCOPY  10/08/2011   Procedure: ARTHROSCOPY KNEE;  Surgeon: [name], MD;  Location: [facility];  Service: Orthopedics;  Laterality: Right;  right knee arthroscopy partial medial and lateral menisectomy and chondroplasty   KNEE ARTHROSCOPY Left 01/04/2014   Procedure: ARTHROSCOPY KNEE;  Surgeon: [name], MD;  Location: [facility];  Service: Orthopedics;  Laterality: Left;  Patient flatlined at outpatient surgery center when she had same surgery a year ago ended up in ICU for two days has had cardiac issues since 2013.   PATENT DUCTUS ARTERIOUS REPAIR  1956   PTOSIS REPAIR Left 04/16/2018   Procedure: PTOSIS REPAIR LEFT EYE;  Surgeon: [name], MD;  Location: [facility];  Service: Ophthalmology;  Laterality: Left;   TOTAL ABDOMINAL HYSTERECTOMY  1991   due to cervical cancer   TOTAL KNEE ARTHROPLASTY Left 10/14/2019   Procedure: LEFT TOTAL KNEE ARTHROPLASTY;  Surgeon: Melrose Nakayama, MD;  Location: [facility];  Service: Orthopedics;  Laterality: Left;   TOTAL KNEE REVISION Left 09/12/2020   Procedure: LEFT TOTAL KNEE REVISION;  Surgeon: Melrose Nakayama, MD;  Location: [facility];  Service: Orthopedics;  Laterality: Left;  3E BED; INS REQUIRES SURGERY ADMIT STATUS BUT PROVIDER WILL DISCHARGE WITHIN 24HRS   TUBAL LIGATION     Social History   Socioeconomic History   Marital status: Single    Spouse name: Not on file   Number of children: 1   Years of education: 16   Highest education level: Not on file  Occupational History   Occupation: Product manager: [employer]  Tobacco Use   Smoking status: Former    Packs/day: 1.00    Years: 23.00    Pack years: 23.00    Types: Cigarettes    Start date: 1968   Smokeless tobacco: Never   Tobacco comments:    quit smoking cigarettes in  1991  Vaping Use   Vaping Use: Never used  Substance and Sexual Activity   Alcohol use: Yes    Comment: 1 daily glass of wine   Drug use: No   Sexual activity: Not Currently  Other Topics Concern   Not on file  Social History Narrative       Social Determinants of Health   Financial Resource Strain: Not on file  Food Insecurity: Not on file  Transportation Needs: Not on file  Physical Activity: Not on file  Stress: Not on file  Social Connections: Not on file   Family History  Problem Relation Age of Onset   Anemia Father    Heart attack Father    Pulmonary embolism Father    Colon cancer Father    Alzheimer's disease Mother    Lupus Sister    Aortic stenosis Son        with AVR at 17 y/o   Suicidality Paternal Grandfather    Dementia Paternal Grandmother    Colon cancer Maternal Grandmother    Allergies  Allergen Reactions   Propofol Anaphylaxis and Other (See Comments)    Pt "flat lined" twice when she had propofol administered to her during another surgery - has received propofol since without incident 09/12/20   Demerol Nausea And Vomiting   Tramadol Nausea And Vomiting and Other (See Comments)    Pt must take with food to tolerate medication   Prior to Admission medications   Medication Sig Start Date End Date Taking? Authorizing Provider  acetaminophen (TYLENOL) 500 MG tablet Take 500 mg by mouth every 6 (six) hours as needed for moderate pain.    [provider]  aspirin 81 MG chewable tablet Chew 1 tablet (81 mg total) by mouth 2 (two) times daily after a meal. 09/12/20   Loni Dolly, PA-C  atorvastatin (LIPITOR) 40 MG tablet Take 80 mg by mouth at bedtime. 07/16/20   [provider]  calcium carbonate (TUMS - DOSED IN MG ELEMENTAL CALCIUM) 500 MG chewable tablet Chew 1-2 tablets by mouth 3 (three) times daily as needed for indigestion or heartburn.    [provider]  Cholecalciferol (VITAMIN D) 50 MCG (2000 UT) CAPS Take 2,000 Units by  mouth in the morning.    [provider]  losartan (COZAAR) 50 MG tablet Take 100 mg by mouth in the morning. 08/01/20   [provider]  PREVIDENT 0.2 % SOLN Take 15 mLs by mouth at bedtime.  11/17/13   [provider]  zaleplon (SONATA) 10 MG capsule Take 10 mg by mouth at bedtime and may repeat dose one time if needed.    [provider]     Positive ROS: Otherwise negative  All other systems have been reviewed and were otherwise negative with the exception of those mentioned in the HPI and as above.  Physical Exam: Constitutional: Alert, well-appearing, no acute distress Ears: External ears without lesions or tenderness. Ear canals right ear canal with minimal cerumen buildup.  Left ear canal which is a little bit narrower on the opening has a large amount of wax obstructing the ear canal that was cleaned with suction and curettes.  TMs were clear bilaterally.. Nasal: External nose without lesions. Clear nasal passages Oral: Oropharynx clear. Neck: No palpable adenopathy or masses Respiratory: Breathing comfortably  Skin: No facial/neck lesions or rash noted.  Cerumen impaction removal  Date/Time: 02/01/2021 1:31 PM Performed by: Rozetta Nunnery, MD Authorized by: Rozetta Nunnery, MD   Consent:    Consent obtained:  Verbal   Consent given by:  Patient   Risks discussed:  Pain and bleeding Procedure details:    Location:  L ear and R ear   Procedure type: curette and suction   Post-procedure details:    Inspection:  TM intact and canal normal   Hearing quality:  Improved   Procedure completion:  Tolerated well, no immediate complications Comments:     Left ear canal with much more wax in the right ear canal.  This was cleaned with suction and curettes.  TMs were otherwise clear.  Assessment: Cerumen buildup  Plan: She will follow-up on a as needed basis and reviewed with her concerning my retirement in 2 months.  Radene Journey, MD

## 2021-02-06 DIAGNOSIS — M1711 Unilateral primary osteoarthritis, right knee: Secondary | ICD-10-CM | POA: Diagnosis not present

## 2021-02-08 DIAGNOSIS — M47816 Spondylosis without myelopathy or radiculopathy, lumbar region: Secondary | ICD-10-CM | POA: Diagnosis not present

## 2021-02-21 DIAGNOSIS — M1711 Unilateral primary osteoarthritis, right knee: Secondary | ICD-10-CM | POA: Diagnosis not present

## 2021-02-23 DIAGNOSIS — M1711 Unilateral primary osteoarthritis, right knee: Secondary | ICD-10-CM | POA: Diagnosis not present

## 2021-02-26 DIAGNOSIS — Z1231 Encounter for screening mammogram for malignant neoplasm of breast: Secondary | ICD-10-CM | POA: Diagnosis not present

## 2021-03-05 DIAGNOSIS — M47816 Spondylosis without myelopathy or radiculopathy, lumbar region: Secondary | ICD-10-CM | POA: Diagnosis not present

## 2021-03-08 DIAGNOSIS — M545 Low back pain, unspecified: Secondary | ICD-10-CM | POA: Diagnosis not present

## 2021-03-10 IMAGING — CR DG CHEST 2V
2 series · 2 of 2 positions shown · non-contrast
Comparison: Chest x-ray 05/24/2014.

CLINICAL DATA: 69-year-old female under preoperative evaluation.

EXAM:
CHEST - 2 VIEW

[w chest pa]
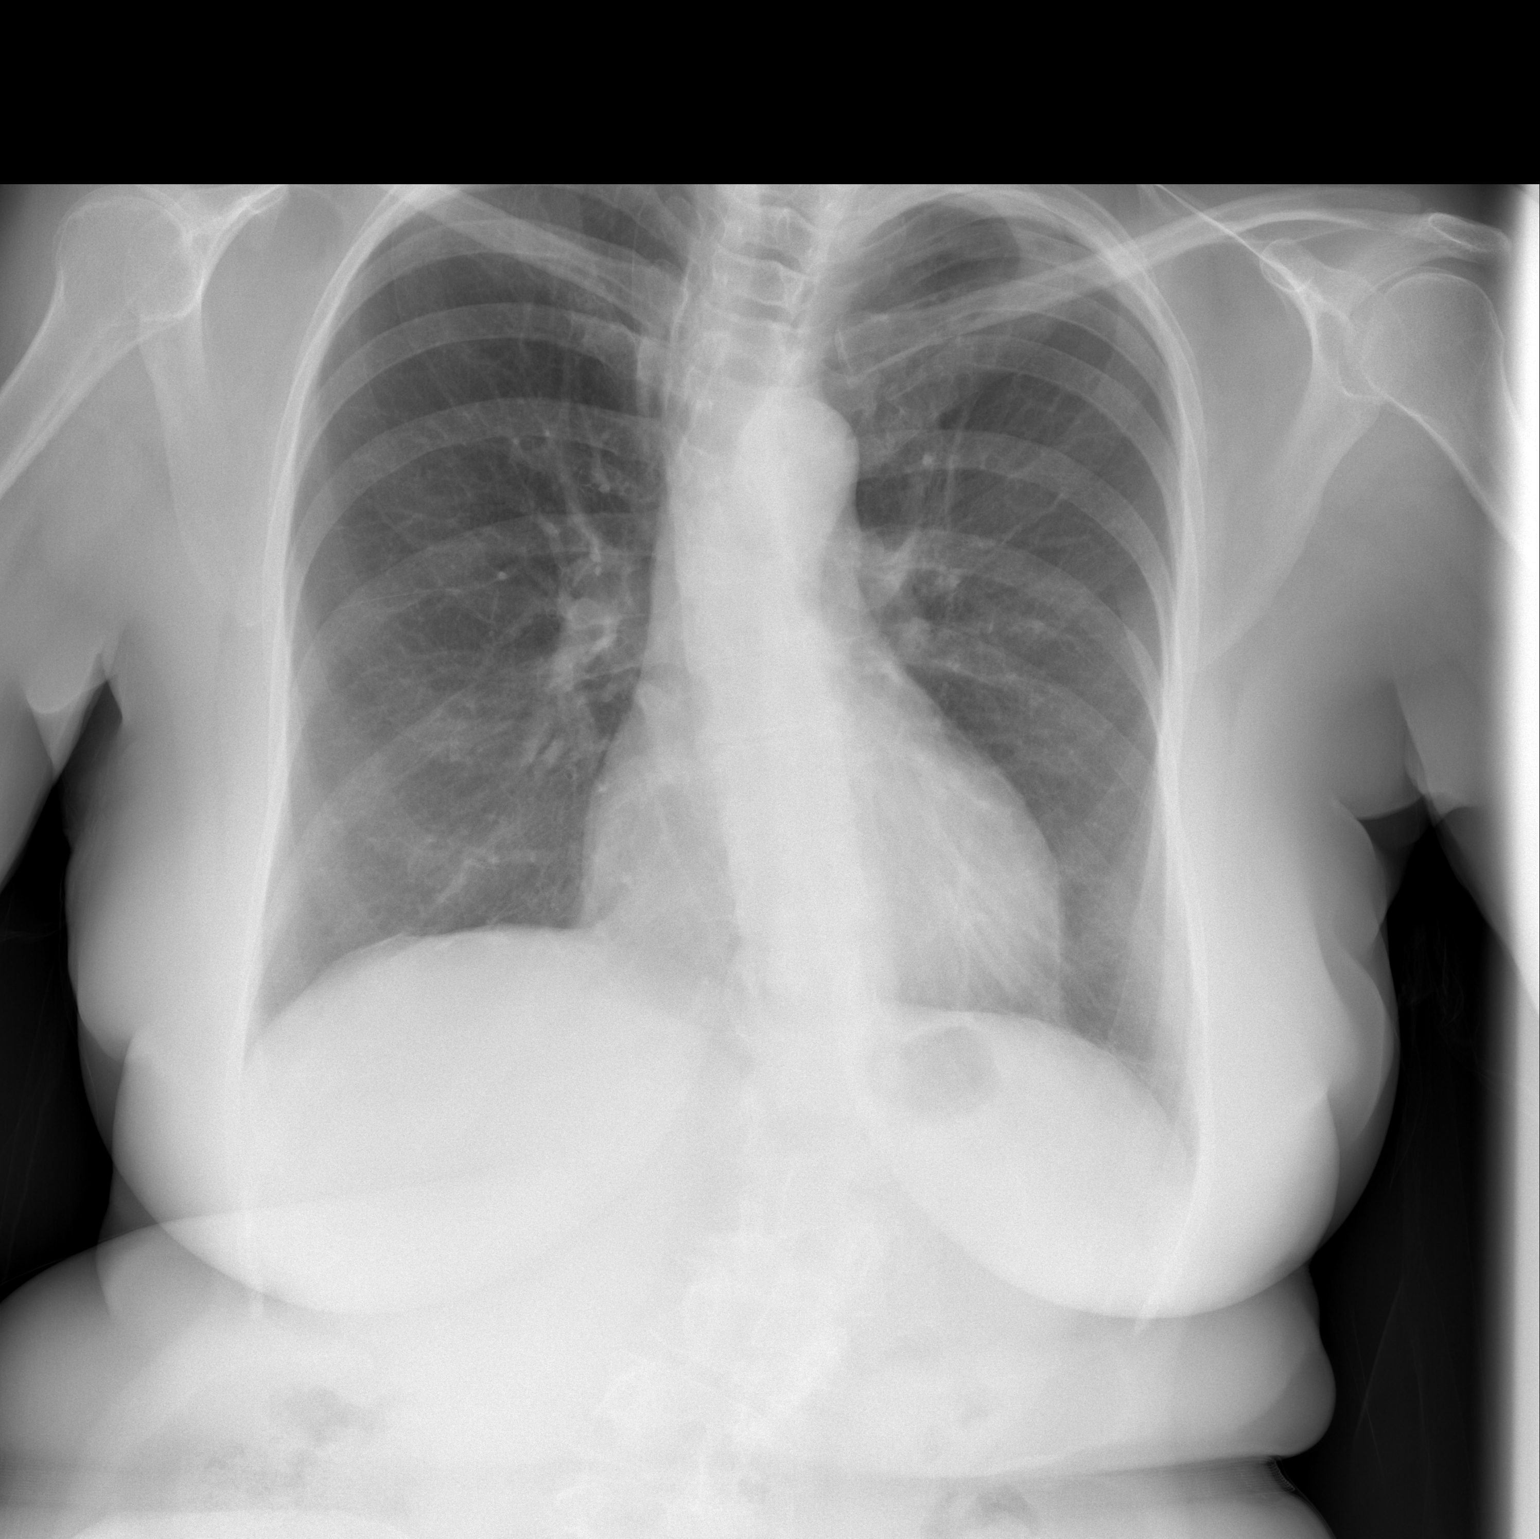

[w chest lat]
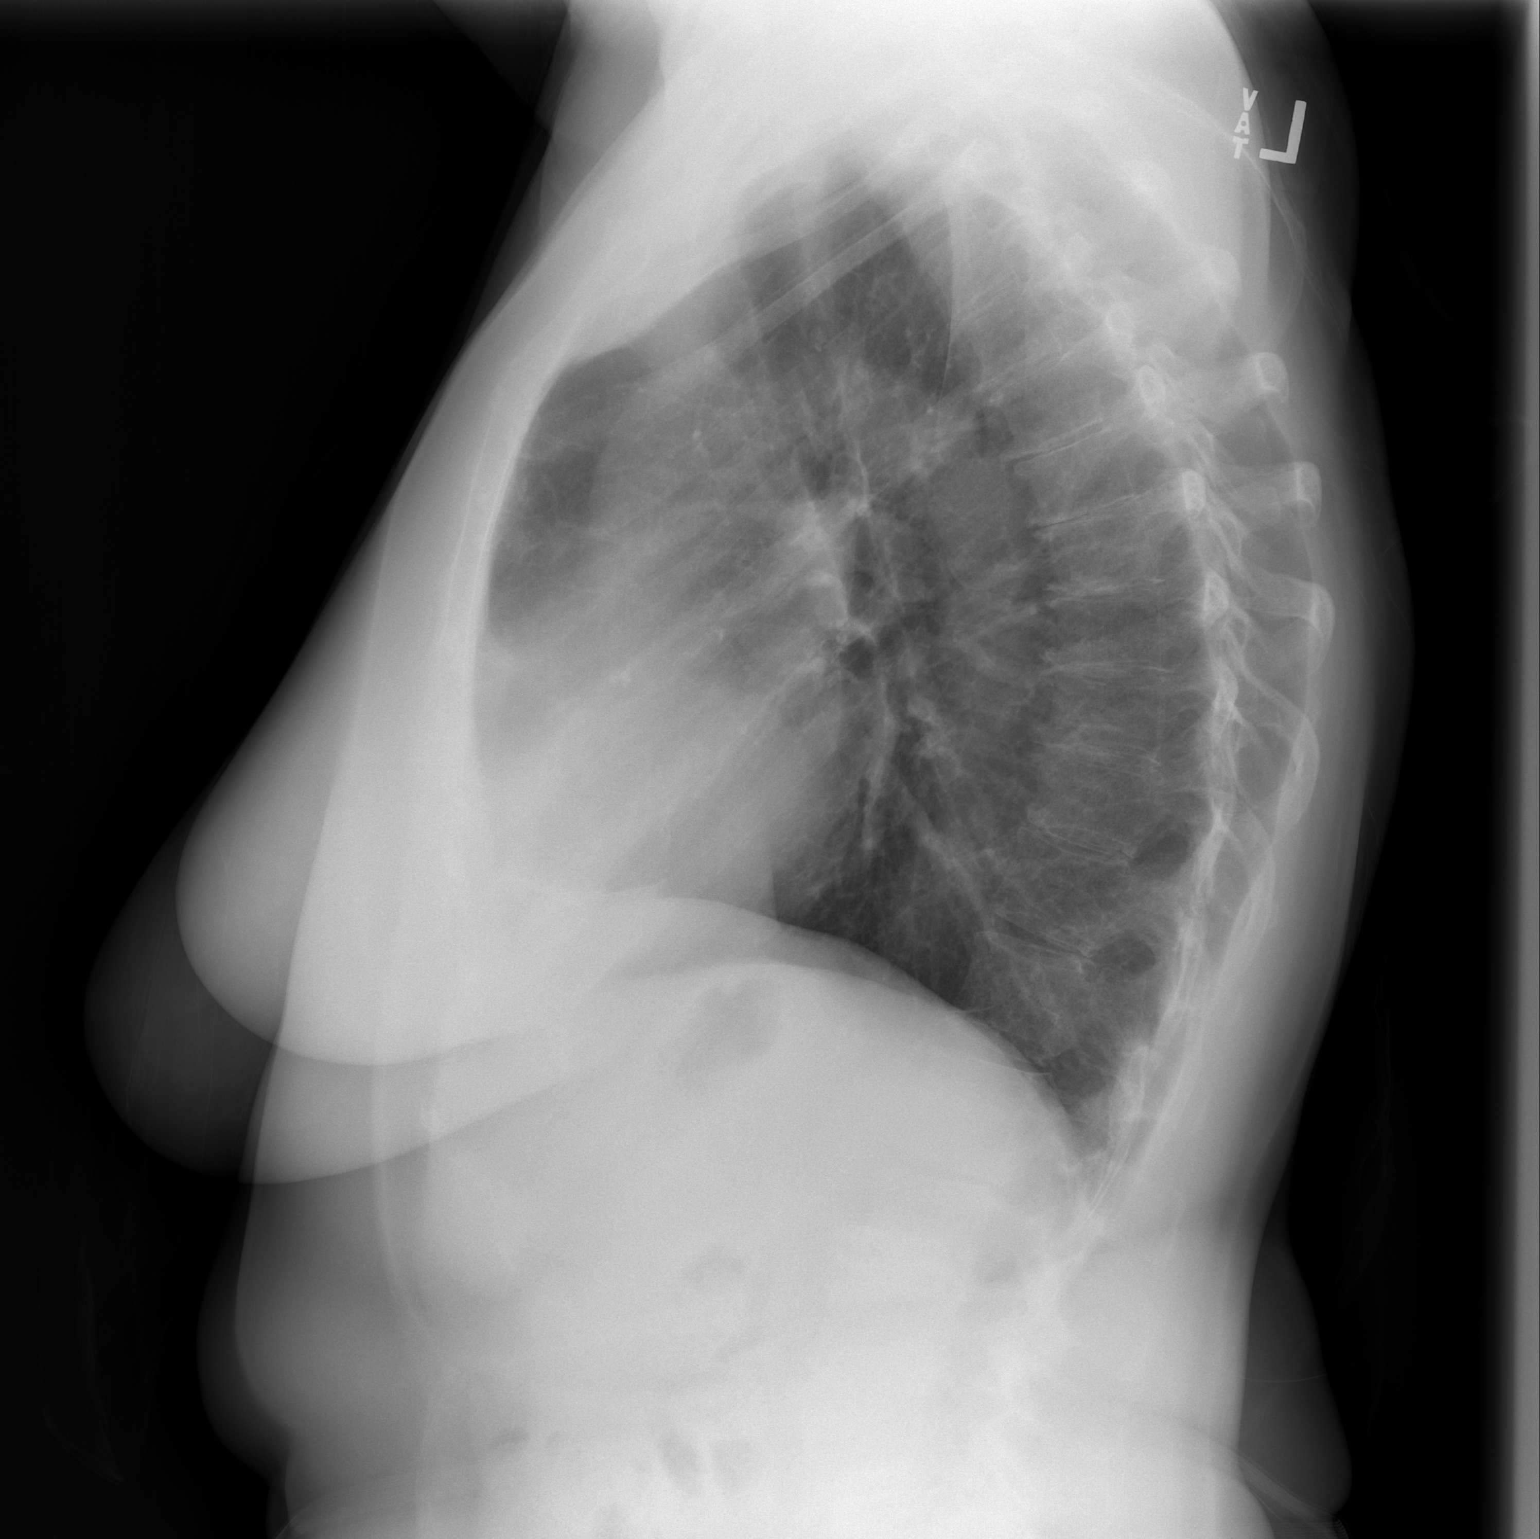

[2 of 2 positions shown; findings below may reference images not displayed]

FINDINGS: Lung volumes are normal. No consolidative airspace disease. No
pleural effusions. No pneumothorax. No pulmonary nodule or mass
noted. Pulmonary vasculature and the cardiomediastinal silhouette
are within normal limits.
IMPRESSION: No radiographic evidence of acute cardiopulmonary disease.

## 2021-03-15 DIAGNOSIS — E782 Mixed hyperlipidemia: Secondary | ICD-10-CM | POA: Diagnosis not present

## 2021-03-15 DIAGNOSIS — Z8774 Personal history of (corrected) congenital malformations of heart and circulatory system: Secondary | ICD-10-CM | POA: Diagnosis not present

## 2021-03-15 DIAGNOSIS — Z8679 Personal history of other diseases of the circulatory system: Secondary | ICD-10-CM | POA: Diagnosis not present

## 2021-03-15 DIAGNOSIS — R001 Bradycardia, unspecified: Secondary | ICD-10-CM | POA: Diagnosis not present

## 2021-03-16 DIAGNOSIS — M545 Low back pain, unspecified: Secondary | ICD-10-CM | POA: Diagnosis not present

## 2021-03-19 DIAGNOSIS — M25562 Pain in left knee: Secondary | ICD-10-CM | POA: Diagnosis not present

## 2021-03-22 DIAGNOSIS — M545 Low back pain, unspecified: Secondary | ICD-10-CM | POA: Diagnosis not present

## 2021-03-23 DIAGNOSIS — H5203 Hypermetropia, bilateral: Secondary | ICD-10-CM | POA: Diagnosis not present

## 2021-03-23 DIAGNOSIS — H2513 Age-related nuclear cataract, bilateral: Secondary | ICD-10-CM | POA: Diagnosis not present

## 2021-03-30 DIAGNOSIS — M545 Low back pain, unspecified: Secondary | ICD-10-CM | POA: Diagnosis not present

## 2021-04-04 DIAGNOSIS — M545 Low back pain, unspecified: Secondary | ICD-10-CM | POA: Diagnosis not present

## 2021-04-06 DIAGNOSIS — M545 Low back pain, unspecified: Secondary | ICD-10-CM | POA: Diagnosis not present

## 2021-04-12 DIAGNOSIS — M545 Low back pain, unspecified: Secondary | ICD-10-CM | POA: Diagnosis not present

## 2021-04-18 DIAGNOSIS — B351 Tinea unguium: Secondary | ICD-10-CM | POA: Diagnosis not present

## 2021-04-18 DIAGNOSIS — R808 Other proteinuria: Secondary | ICD-10-CM | POA: Diagnosis not present

## 2021-04-18 DIAGNOSIS — M545 Low back pain, unspecified: Secondary | ICD-10-CM | POA: Diagnosis not present

## 2021-04-18 DIAGNOSIS — R531 Weakness: Secondary | ICD-10-CM | POA: Diagnosis not present

## 2021-04-18 DIAGNOSIS — E559 Vitamin D deficiency, unspecified: Secondary | ICD-10-CM | POA: Diagnosis not present

## 2021-04-18 DIAGNOSIS — M4124 Other idiopathic scoliosis, thoracic region: Secondary | ICD-10-CM | POA: Diagnosis not present

## 2021-04-18 DIAGNOSIS — I10 Essential (primary) hypertension: Secondary | ICD-10-CM | POA: Diagnosis not present

## 2021-04-18 DIAGNOSIS — Z8673 Personal history of transient ischemic attack (TIA), and cerebral infarction without residual deficits: Secondary | ICD-10-CM | POA: Diagnosis not present

## 2021-04-18 DIAGNOSIS — Z0000 Encounter for general adult medical examination without abnormal findings: Secondary | ICD-10-CM | POA: Diagnosis not present

## 2021-04-18 DIAGNOSIS — G4700 Insomnia, unspecified: Secondary | ICD-10-CM | POA: Diagnosis not present

## 2021-04-18 DIAGNOSIS — Z1211 Encounter for screening for malignant neoplasm of colon: Secondary | ICD-10-CM | POA: Diagnosis not present

## 2021-04-18 DIAGNOSIS — R7301 Impaired fasting glucose: Secondary | ICD-10-CM | POA: Diagnosis not present

## 2021-04-18 DIAGNOSIS — E782 Mixed hyperlipidemia: Secondary | ICD-10-CM | POA: Diagnosis not present

## 2021-04-19 DIAGNOSIS — M545 Low back pain, unspecified: Secondary | ICD-10-CM | POA: Diagnosis not present

## 2021-05-24 DIAGNOSIS — Z1211 Encounter for screening for malignant neoplasm of colon: Secondary | ICD-10-CM | POA: Diagnosis not present

## 2021-05-24 DIAGNOSIS — Z1212 Encounter for screening for malignant neoplasm of rectum: Secondary | ICD-10-CM | POA: Diagnosis not present

## 2022-05-21 IMAGING — MR MR LUMBAR SPINE W/O CM
4 of 5 series · 32 of 48 positions shown · non-contrast
Comparison: Plain films lumbar spine 07/05/2020.

CLINICAL DATA: Worsening chronic low back pain.

EXAM:
MRI LUMBAR SPINE WITHOUT CONTRAST
TECHNIQUE: Multiplanar, multisequence MR imaging of the lumbar spine was
performed. No intravenous contrast was administered.

[Series 5: T1 · sagittal · 4.0mm · 0.81mm/px · 8 of 18 slices shown (1 of 2)]
[im 1/18]
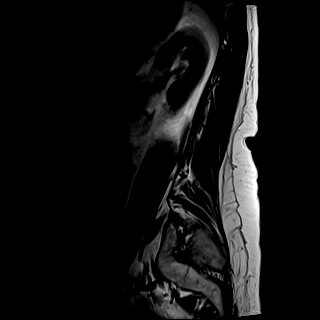
[im 3/18]
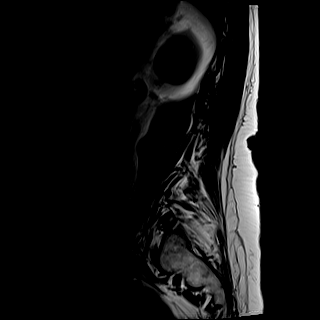
[im 5/18]
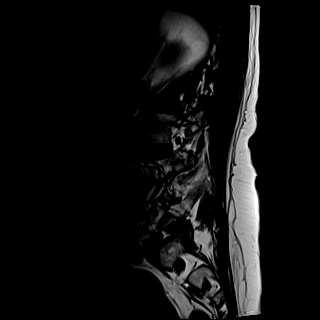
[im 8/18]
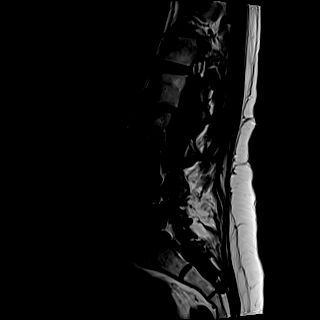
[im 10/18]
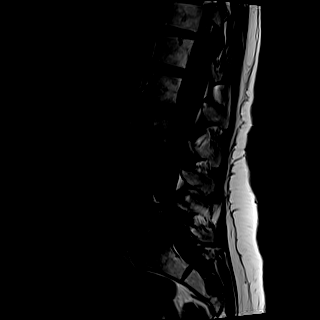
[im 13/18]
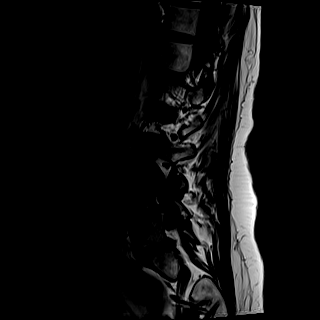
[im 15/18]
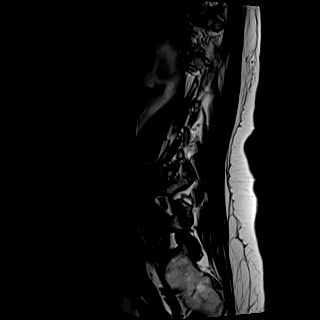
[im 18/18]
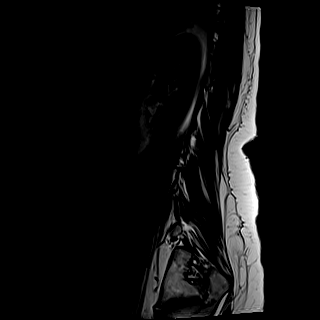

[Series 6: T2 · sagittal · 4.0mm · 0.81mm/px · 7 of 18 slices shown (1 of 2)]
[im 1/18]
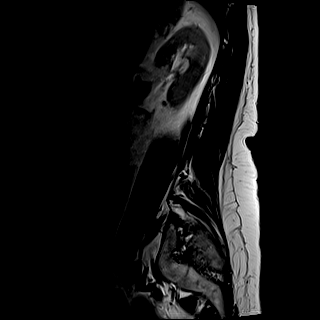
[im 3/18]
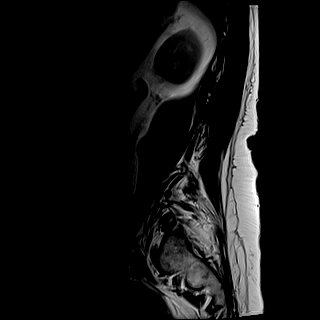
[im 6/18]
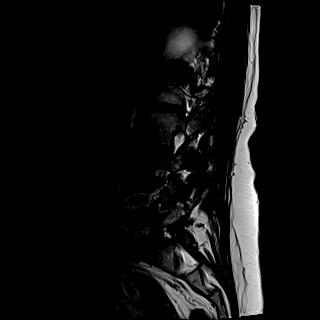
[im 9/18]
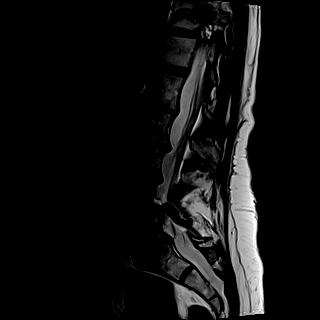
[im 12/18]
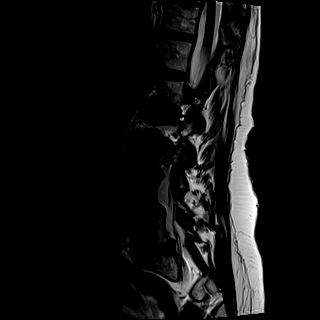
[im 15/18]
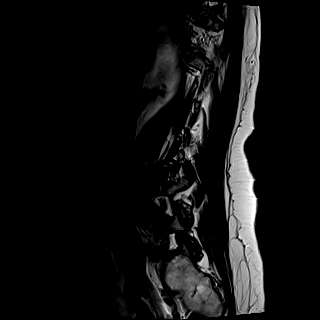
[im 18/18]
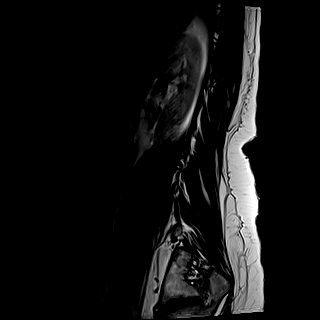

[Series 8: T2 · axial · 4.0mm · 0.69mm/px · z∈[-45,+130]mm · 9 of 33 slices shown (2 of 2)]
[im 1/33]
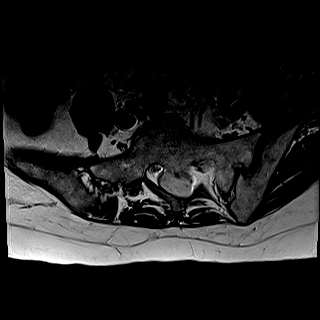
[im 6/33]
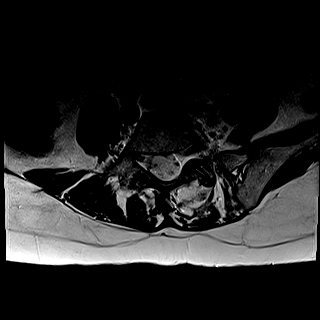
[im 11/33]
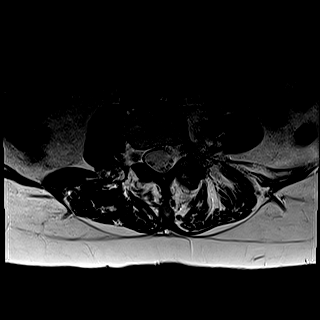
[im 14/33]
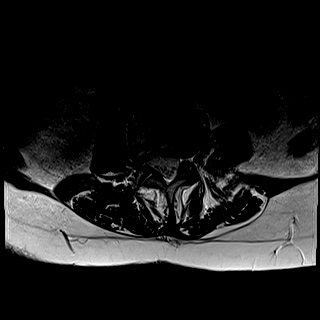
[im 17/33]
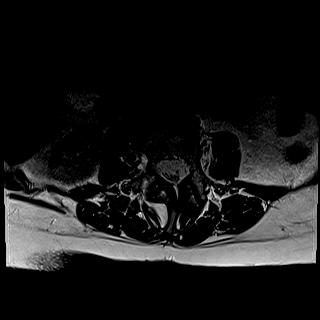
[im 19/33]
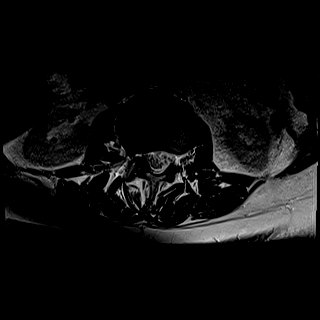
[im 22/33]
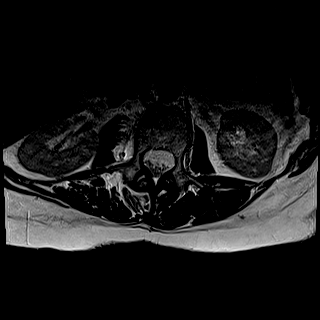
[im 27/33]
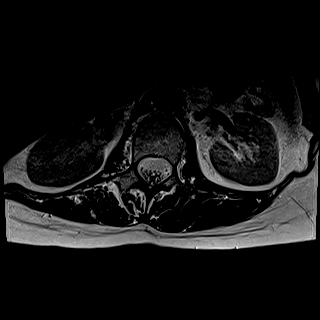
[im 33/33]
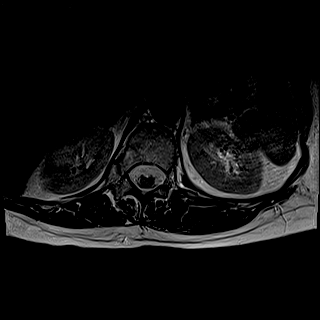

[Series 9: T1 · axial · 4.0mm · 0.43mm/px · z∈[-45,+100]mm · 8 of 33 slices shown (2 of 2)]
[im 1/33]
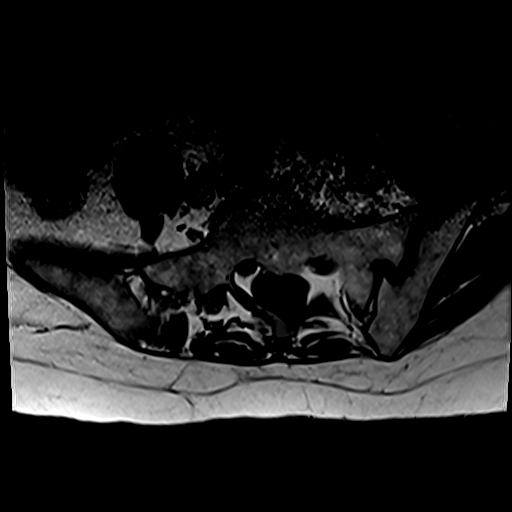
[im 6/33]
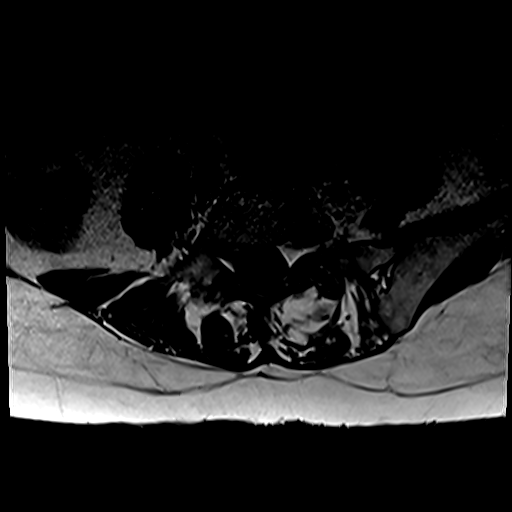
[im 11/33]
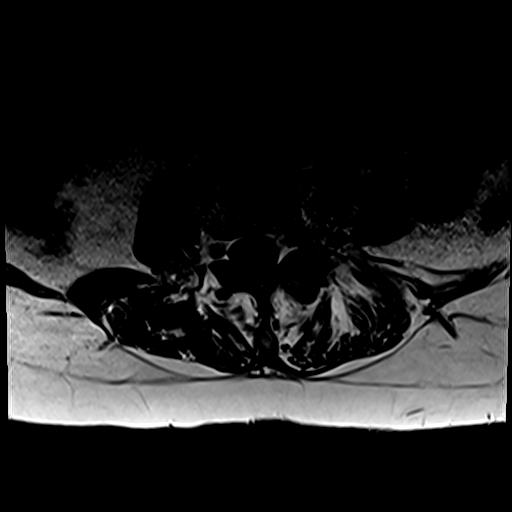
[im 14/33]
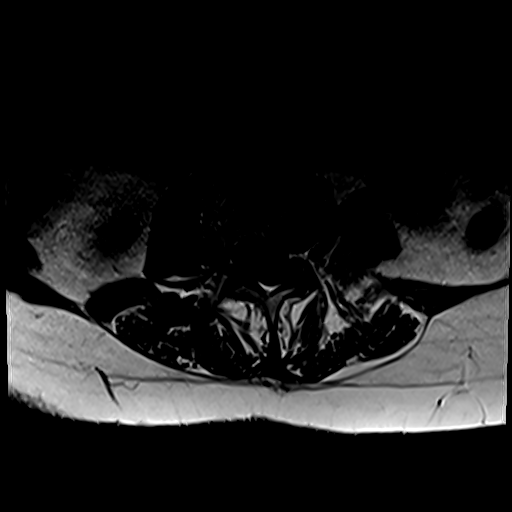
[im 17/33]
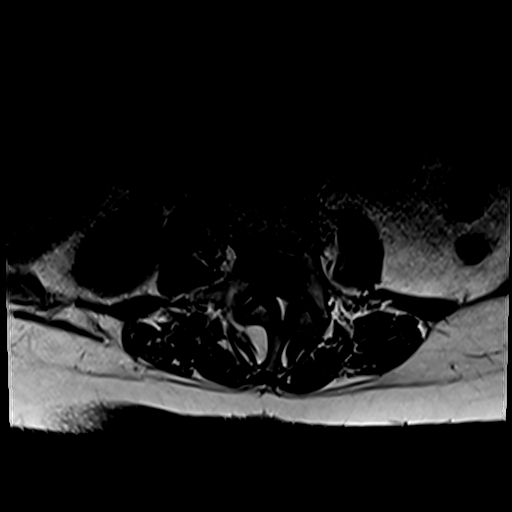
[im 19/33]
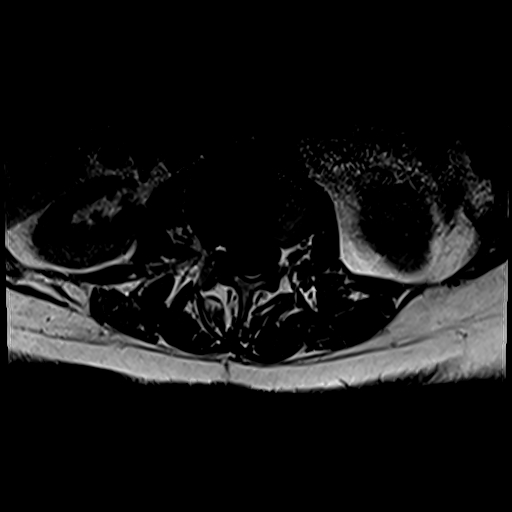
[im 22/33]
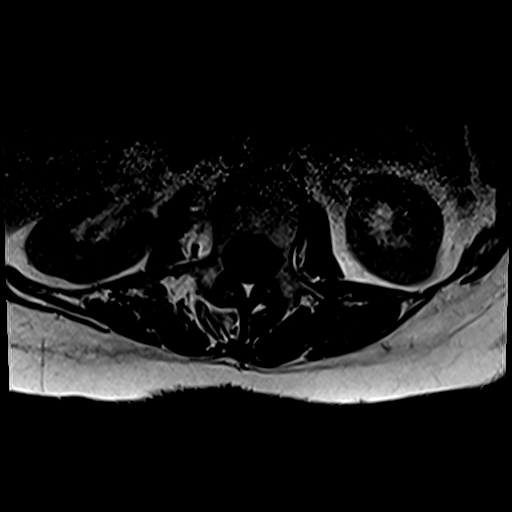
[im 27/33]
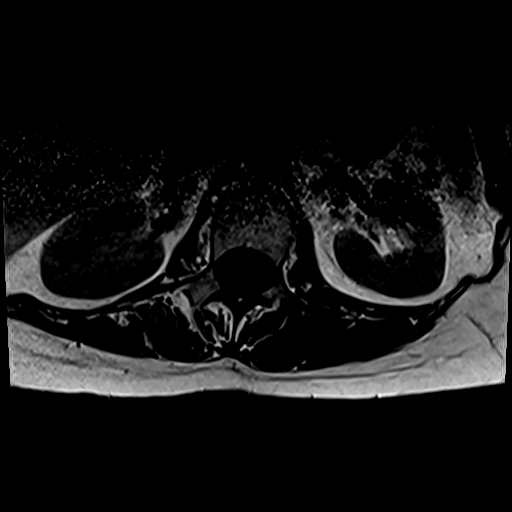

[32 of 48 positions shown; findings below may reference images not displayed]

FINDINGS: Segmentation:  Standard.

Alignment: Marked convex left scoliosis with the apex at L2. Trace
retrolisthesis L2 on L3. 0.5 cm anterolisthesis L4 on L5 due to
facet degenerative disease.

Vertebrae: No fracture, evidence of discitis, or bone lesion.
Multilevel degenerative endplate signal change is present.

Conus medullaris and cauda equina: Conus extends to the L1 level.
Conus and cauda equina appear normal.

Paraspinal and other soft tissues: Negative.

Disc levels:

T11-12 is imaged in the sagittal plane only and negative.

T12-L1: Negative.

L1-2: Shallow right paracentral protrusion.  No stenosis.

L2-3: Loss of disc space height with a shallow bulge and endplate
spur, more prominent to the right. There is mild right foraminal
narrowing. The central canal and left foramen open.

L3-4: Shallow disc bulge and mild facet degenerative change. No
stenosis.

L4-5: Advanced facet degenerative disease. The disc is uncovered.
The central canal right foramen are open. There is moderately severe
left foraminal narrowing.

L5-S1: Mild-to-moderate facet degenerative disease. Shallow
broad-based disc bulge. No stenosis.
IMPRESSION: Severe convex left scoliosis.

Degenerative disease appears worst at L4-5 where advanced facet
arthritis results in 0.5 cm anterolisthesis. There is moderately
severe left foraminal narrowing at this level. The central canal and
right foramen open.
